# Patient Record
Sex: Female | Born: 1965 | Race: White | Hispanic: No | Marital: Married | Smoking: Former smoker
Health system: Southern US, Community
[De-identification: ages and names within clinical notes are randomized; demographics above are authoritative.]

## PROBLEM LIST (undated history)

## (undated) DIAGNOSIS — N83201 Unspecified ovarian cyst, right side: Secondary | ICD-10-CM

## (undated) DIAGNOSIS — N809 Endometriosis, unspecified: Secondary | ICD-10-CM

## (undated) DIAGNOSIS — E7800 Pure hypercholesterolemia, unspecified: Secondary | ICD-10-CM

## (undated) DIAGNOSIS — I10 Essential (primary) hypertension: Secondary | ICD-10-CM

## (undated) DIAGNOSIS — N83202 Unspecified ovarian cyst, left side: Secondary | ICD-10-CM

## (undated) DIAGNOSIS — M1990 Unspecified osteoarthritis, unspecified site: Secondary | ICD-10-CM

## (undated) DIAGNOSIS — E119 Type 2 diabetes mellitus without complications: Secondary | ICD-10-CM

## (undated) HISTORY — DX: Essential (primary) hypertension: I10

## (undated) HISTORY — PX: TONSILLECTOMY: SUR1361

## (undated) HISTORY — DX: Unspecified ovarian cyst, right side: N83.201

## (undated) HISTORY — DX: Endometriosis, unspecified: N80.9

## (undated) HISTORY — DX: Type 2 diabetes mellitus without complications: E11.9

## (undated) HISTORY — PX: ABDOMINAL HYSTERECTOMY: SHX81

## (undated) HISTORY — DX: Pure hypercholesterolemia, unspecified: E78.00

## (undated) HISTORY — PX: TUBAL LIGATION: SHX77

## (undated) HISTORY — DX: Unspecified ovarian cyst, left side: N83.202

## (undated) HISTORY — DX: Unspecified osteoarthritis, unspecified site: M19.90

---

## 1997-09-01 ENCOUNTER — Other Ambulatory Visit: Admission: RE | Admit: 1997-09-01 | Discharge: 1997-09-01 | Payer: Self-pay | Admitting: Obstetrics and Gynecology

## 1998-05-07 ENCOUNTER — Ambulatory Visit (HOSPITAL_COMMUNITY): Admission: RE | Admit: 1998-05-07 | Discharge: 1998-05-07 | Payer: Self-pay | Admitting: Obstetrics and Gynecology

## 1998-09-22 ENCOUNTER — Other Ambulatory Visit: Admission: RE | Admit: 1998-09-22 | Discharge: 1998-09-22 | Payer: Self-pay | Admitting: Obstetrics and Gynecology

## 1999-12-02 ENCOUNTER — Other Ambulatory Visit: Admission: RE | Admit: 1999-12-02 | Discharge: 1999-12-02 | Payer: Self-pay | Admitting: Obstetrics and Gynecology

## 2000-12-12 ENCOUNTER — Other Ambulatory Visit: Admission: RE | Admit: 2000-12-12 | Discharge: 2000-12-12 | Payer: Self-pay | Admitting: Obstetrics and Gynecology

## 2001-12-12 ENCOUNTER — Other Ambulatory Visit: Admission: RE | Admit: 2001-12-12 | Discharge: 2001-12-12 | Payer: Self-pay | Admitting: Obstetrics and Gynecology

## 2002-12-30 ENCOUNTER — Other Ambulatory Visit: Admission: RE | Admit: 2002-12-30 | Discharge: 2002-12-30 | Payer: Self-pay | Admitting: Obstetrics and Gynecology

## 2004-01-19 ENCOUNTER — Other Ambulatory Visit: Admission: RE | Admit: 2004-01-19 | Discharge: 2004-01-19 | Payer: Self-pay | Admitting: Obstetrics and Gynecology

## 2005-02-10 ENCOUNTER — Other Ambulatory Visit: Admission: RE | Admit: 2005-02-10 | Discharge: 2005-02-10 | Payer: Self-pay | Admitting: Obstetrics and Gynecology

## 2007-03-28 HISTORY — PX: LAPAROSCOPIC VAGINAL HYSTERECTOMY WITH SALPINGO OOPHORECTOMY: SHX6681

## 2007-10-09 ENCOUNTER — Encounter (INDEPENDENT_AMBULATORY_CARE_PROVIDER_SITE_OTHER): Payer: Self-pay | Admitting: Obstetrics and Gynecology

## 2007-10-09 ENCOUNTER — Ambulatory Visit (HOSPITAL_COMMUNITY): Admission: RE | Admit: 2007-10-09 | Discharge: 2007-10-10 | Payer: Self-pay | Admitting: Obstetrics and Gynecology

## 2010-08-09 NOTE — Op Note (Signed)
NAME:  Judith Nolan, Judith Nolan NO.:  [id number]   MEDICAL RECORD NO.:  [id number]          PATIENT TYPE:  INP   LOCATION:  9319                          FACILITY:  [facility]   PHYSICIAN:  Carrington Clamp, M.D. DATE OF BIRTH:  1966-02-21   DATE OF PROCEDURE:  10/09/2007  DATE OF DISCHARGE:                               OPERATIVE REPORT   PREOPERATIVE DIAGNOSES:  1. Menorrhagia.  2. Left lower quadrant pain.  3. Right ovarian cyst.   POSTOPERATIVE DIAGNOSES:  1. Menorrhagia.  2. Left lower quadrant pain.  3. Right ovarian cyst.  4. Endometriosis.   PROCEDURES:  Laparoscopic-assisted vaginal hysterectomy with bilateral  salpingo-oophorectomy.   SURGEON:  Carrington Clamp, MD.   ASSISTANTLuvenia Redden, MD   ANESTHESIA:  General endotracheal.   FINDINGS:  A 10 weeks' size bulky the uterus with a bulky cervix.  There  were small areas of endometriosis seen on the left broad ligament and  the left ovary.  Normal tubes were seen with a simple cyst on the right  ovary that measured about 3 cm.  Ureters were seen well out of the field  of dissection both before during and after the operation.  Small  pedunculated fibroids in the fundus and the uterus.   ESTIMATED BLOOD LOSS:  600 mL.   SPECIMENS:  Uterus, cervix, ovaries, and tubes.   IV FLUIDS:  2500 mL.   URINE OUTPUT:  Not measured.   COMPLICATIONS:  None.   MEDICATIONS:  Enfamil and 1% lidocaine with epinephrine.   COUNTS:  Were correct x3.   TECHNIQUE:  After adequate general anesthesia was achieved, the patient  was prepped, draped in usual sterile fashion in dorsal lithotomy  position.  Bladder was emptied with a red rubber catheter.  Uterine  manipulator placed inside the of the cervix.  Attention was turned to  the abdomen where a 2 cm infraumbilical incision was made with the  scalpel and the OptiVu scope was used to get through the fascia.  Unfortunately, were then able to penetrate the peritoneum  with the  OptiVu so a Veress needle was placed in the same tract and the abdomen  insufflated without complication.  There is no bowel adhesions of  anything to the anterior abdominal wall once the scope was able to be  placed inside the abdomen via the Optiscope trocar.  The above findings  were then noted and the infundibulopelvic ligaments were cauterized  bilaterally with the triple polar.  Dissection of the mesosalpinx and  broad ligament was then performed with the triple polar cautery  bilaterally to the level of the reflection of the bladder onto the  cervix.  The ureters were seen well out of the field of dissection.   Attention was then turned to the vagina where the uterine manipulator  was removed and 60 mL of Enfamil were injected into the bladder.  A  duckbill speculum placed in the vagina and the cervix was then injected  with 1% Lidocaine with epinephrine in a circumferential manner.  A  scalpel was used to make a circumferential incision at the  level of the  reflection of the vagina onto the cervix.  Mayo's were used to enter the  posterior cul-de-sac and the long duckbill retractor placed.   Dissection anteriorly was begun of the vesicouterine fascia off of the  cervix.  The uterosacrals were secured with Heaney clamps bilaterally  and each pedicle was incised the Mayo scissors and secured with a Heaney  stitch of 0 Vicryl.  The anterior peritoneum was then entered carefully  with the dissection of the Metzenbaum scissors of the bladder off of the  cervix.  Retractor was used to retract the bladder out of the way once  the peritoneum had been entered.   Alternating successive bites of the Heaney clamp were used to secure the  cardinal ligament.  Each pedicle was incised with Mayo scissors and  secured with a stitch of 0 Vicryl.  At the level of the initial  dissection superiorly with the triple polar, Heaney's were able to be  placed and each pedicle was incised and  secured with a stitch of 0  Vicryl.  Unfortunately, the fibroid was retracted medially off the  cervix and had been excised off of the anterior broad ligament and  therefore was still inside the abdomen free floating.  Bleeding was  brisk from the vaginal cuff, but all the pedicles were otherwise  hemostatic.  The vaginal cuff was run with a 2-0 Vicryl Smead-Jones  stitch in running locked fashion.  The peritoneum was closed with a  pursestring suture that incorporated the anterior peritoneum.  The left  uterosacral ligament went through the posterior cul-de-sac into the  vagina back into the vagina, did a modified Halban's culdoplasty through  the posterior cul-de-sac into the vagina back from the vagina back into  the posterior cul-de-sac well between the uterosacrals, incorporated the  right uterosacral and anterior peritoneum was cinched down in a  pursestring fashion.  The cuff was then closed with multiple figure-of-  eight stitches of 0 Vicryl.   Attention was then turned to the abdomen after gloves were changed and  scope was placed back inside the abdomen.  An attempt was made to secure  the fibroid with an Endobag, but unfortunately the Endobag tore.  Grasper was then used to grasp the fibroid and bring it through the 10  mm trocar site.  Once this was reperformed, the ureters were rechecked  and found to be peristalsing out of the field of dissection.  Hemostasis  was achieved and all instruments drawn from the abdomen.  The abdomen  desufflated.  The 2 cm infraumbilical fascial incision was closed with a  figure-of-eight stitch of 2-0 Vicryl.  The 5 mm trocar sites that had  been placed under direct visualization of the camera were closed.  Skin  incisions were closed with 3-0 Vicryl Rapide with a through-and-through  stitch.  The incisions were closed with Dermabond and a Foley was placed  in the bladder and all instruments withdrawn from the vagina.     Carrington Clamp,  M.D.  Electronically Signed    MH/MEDQ  D:  10/09/2007  T:  10/10/2007  Job:  [id number]

## 2010-08-24 ENCOUNTER — Ambulatory Visit (HOSPITAL_COMMUNITY)
Admission: RE | Admit: 2010-08-24 | Discharge: 2010-08-24 | Disposition: A | Discharge status: Home / Self Care | Payer: BC Managed Care – PPO | Source: Ambulatory Visit | Attending: Obstetrics and Gynecology | Admitting: Obstetrics and Gynecology

## 2010-08-24 DIAGNOSIS — M25519 Pain in unspecified shoulder: Secondary | ICD-10-CM | POA: Insufficient documentation

## 2010-08-24 DIAGNOSIS — R079 Chest pain, unspecified: Secondary | ICD-10-CM | POA: Insufficient documentation

## 2010-12-23 LAB — COMPREHENSIVE METABOLIC PANEL
ALT: 15
Alkaline Phosphatase: 47
BUN: 6
CO2: 27
Chloride: 105
Glucose, Bld: 133 — ABNORMAL HIGH
Potassium: 4.1
Sodium: 137
Total Bilirubin: 0.7

## 2010-12-23 LAB — CBC
HCT: 32.2 — ABNORMAL LOW
Hemoglobin: 11 — ABNORMAL LOW
MCV: 85.6
Platelets: 329
RBC: 3.74 — ABNORMAL LOW
RBC: 4.64
WBC: 13.9 — ABNORMAL HIGH
WBC: 8.5

## 2010-12-23 LAB — PREGNANCY, URINE: Preg Test, Ur: NEGATIVE

## 2011-09-27 ENCOUNTER — Other Ambulatory Visit: Payer: Self-pay | Admitting: Obstetrics and Gynecology

## 2012-03-27 DIAGNOSIS — I10 Essential (primary) hypertension: Secondary | ICD-10-CM

## 2012-03-27 HISTORY — DX: Essential (primary) hypertension: I10

## 2013-08-13 ENCOUNTER — Ambulatory Visit (INDEPENDENT_AMBULATORY_CARE_PROVIDER_SITE_OTHER): Payer: BC Managed Care – PPO | Admitting: Podiatry

## 2013-08-13 ENCOUNTER — Ambulatory Visit (INDEPENDENT_AMBULATORY_CARE_PROVIDER_SITE_OTHER): Payer: BC Managed Care – PPO

## 2013-08-13 ENCOUNTER — Encounter: Payer: Self-pay | Admitting: Podiatry

## 2013-08-13 VITALS — BP 154/88 | HR 71 | Resp 15 | Ht 67.0 in | Wt 215.0 lb

## 2013-08-13 DIAGNOSIS — M79673 Pain in unspecified foot: Secondary | ICD-10-CM

## 2013-08-13 DIAGNOSIS — M79609 Pain in unspecified limb: Secondary | ICD-10-CM

## 2013-08-13 DIAGNOSIS — M722 Plantar fascial fibromatosis: Secondary | ICD-10-CM

## 2013-08-13 MED ORDER — TRIAMCINOLONE ACETONIDE 10 MG/ML IJ SUSP
10.0000 mg | Freq: Once | INTRAMUSCULAR | Status: AC
  Administered 2013-08-13: 10 mg

## 2013-08-13 NOTE — Patient Instructions (Signed)
Using 200 mg ibuprofen tablets taking 2 tablets 3 times a day x14 days Bent knee stretching 2 minutes 3 times a day Bring in existing rigid orthotic Plantar Fasciitis Plantar fasciitis is a common condition that causes foot pain. It is soreness (inflammation) of the band of tough fibrous tissue on the bottom of the foot that runs from the heel bone (calcaneus) to the ball of the foot. The cause of this soreness may be from excessive standing, poor fitting shoes, running on hard surfaces, being overweight, having an abnormal walk, or overuse (this is common in runners) of the painful foot or feet. It is also common in aerobic exercise dancers and ballet dancers. SYMPTOMS  Most people with plantar fasciitis complain of:  Severe pain in the morning on the bottom of their foot especially when taking the first steps out of bed. This pain recedes after a few minutes of walking.  Severe pain is experienced also during walking following a long period of inactivity.  Pain is worse when walking barefoot or up stairs DIAGNOSIS   Your caregiver will diagnose this condition by examining and feeling your foot.  Special tests such as X-rays of your foot, are usually not needed. PREVENTION   Consult a sports medicine professional before beginning a new exercise program.  Walking programs offer a good workout. With walking there is a lower chance of overuse injuries common to runners. There is less impact and less jarring of the joints.  Begin all new exercise programs slowly. If problems or pain develop, decrease the amount of time or distance until you are at a comfortable level.  Wear good shoes and replace them regularly.  Stretch your foot and the heel cords at the back of the ankle (Achilles tendon) both before and after exercise.  Run or exercise on even surfaces that are not hard. For example, asphalt is better than pavement.  Do not run barefoot on hard surfaces.  If using a treadmill, vary  the incline.  Do not continue to workout if you have foot or joint problems. Seek professional help if they do not improve. HOME CARE INSTRUCTIONS   Avoid activities that cause you pain until you recover.  Use ice or cold packs on the problem or painful areas after working out.  Only take over-the-counter or prescription medicines for pain, discomfort, or fever as directed by your caregiver.  Soft shoe inserts or athletic shoes with air or gel sole cushions may be helpful.  If problems continue or become more severe, consult a sports medicine caregiver or your own health care provider. Cortisone is a potent anti-inflammatory medication that may be injected into the painful area. You can discuss this treatment with your caregiver. MAKE SURE YOU:   Understand these instructions.  Will watch your condition.  Will get help right away if you are not doing well or get worse. Document Released: 12/06/2000 Document Revised: 06/05/2011 Document Reviewed: 02/05/2008 [facility] Patient Information 2014 [employer] [place].

## 2013-08-13 NOTE — Progress Notes (Signed)
   Subjective:    Patient ID: Judith Nolan, female    DOB: 1965/12/13, 48 y.o.   MRN: [id number]  HPI Comments: N heel pain L right heel medial pain D over 1 year O continuous, worsening C bruised sensation A long periods of walk, end of the day [facility] prescribed Celebrex, that pt did not take, night splint occasionally, then family doctor gave steriod injection.    Pt states her family doctor also gave steroid pack, and stretches. The patient wears an over-the-counter flexible foot support for many years. In addition, she describes a more rigid orthotic which he does not have with her today.   Review of Systems  All other systems reviewed and are negative.      Objective:   Physical Exam Orientated x3 white female  Vascular: DP and PT pulses 2/4 bilaterally  Neurological: Sensation to 10 g monofilament wire intact 5/5 bilaterally Vibratory sensation intact bilaterally Ankle reflexes reactive bilaterally  Dermatological: Texture and turgor within normal limits bilaterally  Musculoskeletal: Palpable tenderness in the medial plantar left heel without a palpable lesions. This area duplicates the patient's area of discomfort. Upon weightbearing patient has a vertical heel and when she stands on her toes the heels invert bilaterally.   X-ray report weightbearing right foot   Intact bony structure without fracture and/or dislocation noted  Well-organized inferior calcaneal spur  No deformities noted  Radiographic impression:  No acute bony abnormality noted in the right foot           Assessment & Plan:   Assessment: Plantar fasciitis right  Plan: Offered patient Kenalog injection which he verbally consents The skin is prepped with alcohol and Betadine and 10 mg of Kenalog mixed with 10 mg of plain Xylocaine and 2.5 mg of plain Marcaine were injected into the inferior heel right for Kenalog injection #1 (please note patient said 1 steroid  injection approximately year prior)  She is advised to use over-the-counter 200 mg ibuprofen tablets by mouth 2 tablets 3 times a day x14 days Shoeing and stretching discussed  Maintain existing semirigid over-the-counter orthotic  Patient's request to bring in the existing rigid orthotic to see if it is adequate. If not we'll consider a custom rigid orthotic.  Reappoint x2 weeks

## 2013-08-14 ENCOUNTER — Encounter: Payer: Self-pay | Admitting: Podiatry

## 2013-08-27 ENCOUNTER — Ambulatory Visit (INDEPENDENT_AMBULATORY_CARE_PROVIDER_SITE_OTHER): Payer: BC Managed Care – PPO | Admitting: Podiatry

## 2013-08-27 ENCOUNTER — Encounter: Payer: Self-pay | Admitting: Podiatry

## 2013-08-27 VITALS — BP 124/80 | HR 59 | Resp 15 | Ht 67.0 in | Wt 215.0 lb

## 2013-08-27 DIAGNOSIS — M722 Plantar fascial fibromatosis: Secondary | ICD-10-CM

## 2013-08-27 MED ORDER — TRIAMCINOLONE ACETONIDE 10 MG/ML IJ SUSP
10.0000 mg | Freq: Once | INTRAMUSCULAR | Status: AC
  Administered 2013-08-27: 10 mg

## 2013-08-27 NOTE — Progress Notes (Signed)
Patient ID: Judith Nolan, female   DOB: Dec 02, 1965, 48 y.o.   MRN: [id number]  Subjective: This patient presents today stating that the right heel has less pain after the Kenalog Injection visit of 08/14/2013. Denies any complaints from ibuprofen over-the-counter tablets, however, admits that she only takes 4 tablets a day  Objective: There is palpable tenderness in medial plantar aspect of the right heel without a palpable lesions. Patient has a semirigid orthotic with intrinsic rear foot and forefoot post covered with leather the contour satisfactorily assessment:  Assessment: Improving plantar fasciitis right  Plan: Skin is prepped with alcohol and Betadine and 10 mg of Kenalog mixed with 10 mg of plain Xylocaine and 2.5 mg of plain Marcaine are injected inferior heel right for Kenalog injection #2.  Maintained bent knee stretching Continue on over-the-counter ibuprofen tablets 2 tablets by mouth 3 times a day  Reappoint x4 weeks

## 2013-08-27 NOTE — Patient Instructions (Signed)
Bent knee stretching with shoes on 3 times a day x2 minutes

## 2013-09-24 ENCOUNTER — Ambulatory Visit (INDEPENDENT_AMBULATORY_CARE_PROVIDER_SITE_OTHER): Payer: BC Managed Care – PPO | Admitting: Podiatry

## 2013-09-24 ENCOUNTER — Encounter: Payer: Self-pay | Admitting: Podiatry

## 2013-09-24 VITALS — BP 142/76 | HR 62 | Resp 12

## 2013-09-24 DIAGNOSIS — M722 Plantar fascial fibromatosis: Secondary | ICD-10-CM

## 2013-09-25 ENCOUNTER — Encounter: Payer: Self-pay | Admitting: Podiatry

## 2013-09-25 NOTE — Progress Notes (Signed)
Patient ID: Mearl LatinKathleen F Nolan, female   DOB: 03/04/1966, 48 y.o.   MRN: [id number]  Subjective: Patient presents postop a right plantar fasciitis after having 2 Kenalog injections. At this time the right heel has improved considerably. Currently she is taking over-the-counter ibuprofen 200 mg tablets 3 times a day without a complaint medication She has a over-the-counter semirigid orthotic which exacerbates the right plantar fasciitis when she removes it the heel pain improves  Objective: Orientated x323 space 48 year old white female  There is palpable tenderness in the medial plantar right heel without a palpable lesions. This area duplicates her area of discomfort There is no erythema, edema, ecchymosis or skin lesions noted in the right foot  Assessment: Improving but persistent low-grade plantar fasciitis right Exacerbation of right heel pain with semirigid orthotic  Plan: DC wearing the semirigid orthotic Wear comfortable athletic running or walking shoes Maintain over-the-counter 200 mg tablets of ibuprofen 2 tablets 3 times a day an additional 2 weeks Maintain stretching  Reappoint at patient's request  If patient return complaining of right plantar fasciitis we'll consider a accommodative orthotic with a soft top cover.

## 2014-03-27 DIAGNOSIS — E7800 Pure hypercholesterolemia, unspecified: Secondary | ICD-10-CM

## 2014-03-27 HISTORY — DX: Pure hypercholesterolemia, unspecified: E78.00

## 2014-11-20 ENCOUNTER — Other Ambulatory Visit: Payer: Self-pay | Admitting: Obstetrics and Gynecology

## 2014-11-20 LAB — HM PAP SMEAR: HM PAP: NEGATIVE

## 2014-11-23 LAB — CYTOLOGY - PAP

## 2014-11-24 LAB — HM MAMMOGRAPHY

## 2015-06-30 ENCOUNTER — Telehealth: Payer: Self-pay | Admitting: Family Medicine

## 2015-06-30 NOTE — Telephone Encounter (Signed)
[facility]ammogram,pap

## 2015-07-05 ENCOUNTER — Encounter: Payer: Self-pay | Admitting: *Deleted

## 2015-07-08 ENCOUNTER — Ambulatory Visit (INDEPENDENT_AMBULATORY_CARE_PROVIDER_SITE_OTHER): Payer: Managed Care, Other (non HMO) | Admitting: Family Medicine

## 2015-07-08 ENCOUNTER — Encounter: Payer: Self-pay | Admitting: Family Medicine

## 2015-07-08 VITALS — BP 112/68 | HR 80 | Ht 67.0 in | Wt 204.4 lb

## 2015-07-08 DIAGNOSIS — I10 Essential (primary) hypertension: Secondary | ICD-10-CM | POA: Diagnosis not present

## 2015-07-08 DIAGNOSIS — E119 Type 2 diabetes mellitus without complications: Secondary | ICD-10-CM | POA: Diagnosis not present

## 2015-07-08 DIAGNOSIS — Z1159 Encounter for screening for other viral diseases: Secondary | ICD-10-CM | POA: Diagnosis not present

## 2015-07-08 DIAGNOSIS — Z5181 Encounter for therapeutic drug level monitoring: Secondary | ICD-10-CM

## 2015-07-08 DIAGNOSIS — E782 Mixed hyperlipidemia: Secondary | ICD-10-CM | POA: Diagnosis not present

## 2015-07-08 LAB — LIPID PANEL
CHOLESTEROL: 182 mg/dL (ref 125–200)
HDL: 42 mg/dL — ABNORMAL LOW (ref >=46)
LDL Cholesterol: 85 mg/dL (ref <130)
Total CHOL/HDL Ratio: 4.3 Ratio (ref <=5.0)
Triglycerides: 277 mg/dL — ABNORMAL HIGH (ref <150)
VLDL: 55 mg/dL — ABNORMAL HIGH (ref <30)

## 2015-07-08 LAB — HEPATIC FUNCTION PANEL
ALT: 18 U/L (ref 6–29)
AST: 17 U/L (ref 10–35)
Albumin: 4.6 g/dL (ref 3.6–5.1)
Alkaline Phosphatase: 56 U/L (ref 33–130)
BILIRUBIN DIRECT: 0.1 mg/dL (ref <=0.2)
BILIRUBIN INDIRECT: 0.4 mg/dL (ref 0.2–1.2)
BILIRUBIN TOTAL: 0.5 mg/dL (ref 0.2–1.2)
Total Protein: 7.2 g/dL (ref 6.1–8.1)

## 2015-07-08 LAB — GLUCOSE, RANDOM: Glucose, Bld: 88 mg/dL (ref 65–99)

## 2015-07-08 LAB — POCT GLYCOSYLATED HEMOGLOBIN (HGB A1C): HEMOGLOBIN A1C: 5.9

## 2015-07-08 MED ORDER — LISINOPRIL 20 MG PO TABS: 20.0000 mg | ORAL_TABLET | Freq: Every day | ORAL | Status: DC

## 2015-07-08 MED ORDER — METFORMIN HCL 500 MG PO TABS: 500.0000 mg | ORAL_TABLET | Freq: Two times a day (BID) | ORAL | Status: DC

## 2015-07-08 MED ORDER — FISH OIL 1000 MG PO CPDR: 2.0000 | DELAYED_RELEASE_CAPSULE | Freq: Two times a day (BID) | ORAL | Status: DC

## 2015-07-08 NOTE — Progress Notes (Signed)
Chief Complaint  Patient presents with  . Establish Care    med check for diabetes-has been almost 6 months since last med check with Dr.York in [place].    Diabetes was diagnosed last year. Last check was good "body not reading that I'm a diabetic". Fasting sugar today was 118, usually ranging 118-130's.  She checked it after work one day and it was 60 (felt tired).  She is due in June for eye exam. Never had diabetic eye exam. Never gave a urine specimen at her prior doctor's office. Records from former PCP not yet received.  Hypertension: occasionally checks BP at home, always normal.  No headaches, dizziness, side effects. No cough, chest pain, edema.  She sees Dr. Philis Pique for GYN.  She was referred to Dr. Adriana Mccallum for colonoscopy but hasn't scheduled this yet.  +family history colon cancer.  While in office, we got [facility] to fax her last labs. 01/26/15 CBC normal c-met normal except fasting glucose 113 Chol 176, TG 367, HDL 31, LDL 72 TSH 3.267 A1c 6.0  (6.2% in 07/2014) Vitamin D-OH 46  Denies any regular exercise. 1/2 and 1/2 tea (sweet/unsweet), no sodas or juices.  Past Medical History  Diagnosis Date  . Hypertension 2014  . Type 2 diabetes mellitus ([place]) age [age]  . Pure hypercholesterolemia 2016  . Endometriosis     s/p hyst/BSO  . Bilateral ovarian cysts     s/p hyst/BSO   Past Surgical History  Procedure Laterality Date  . Laparoscopic vaginal hysterectomy with salpingo oophorectomy  2009    complete; for ovarian cysts, endometriosis  . Tonsillectomy  age 18  . Tubal ligation     Social History   Social History  . Marital Status: Married    Spouse Name: N/A  . Number of Children: 1  . Years of Education: N/A   Occupational History  . office work--[employer]    Social History Main Topics  . Smoking status: Former Research scientist (life sciences)  . Smokeless tobacco: Not on file     Comment: smoked x 1 year when she was 89  . Alcohol Use: No  .  Drug Use: No  . Sexual Activity:    Partners: Male    Birth Control/ Protection: Surgical   Other Topics Concern  . Not on file   Social History Narrative   Married, 1 son, 2 dogs, 1 cat.  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   Son lives across the street with his girlfriend.   Family History  Problem Relation Age of Onset  . Hypertension Mother   . Cancer Maternal Grandmother     breast cancer in her 80's  . Breast cancer Maternal Grandmother   . Cancer Maternal Grandfather     colon (over the age of 37)  . Colon cancer Maternal Grandfather   . Diabetes Paternal Grandmother   . Diabetes Paternal Grandfather   . Heart disease Neg Hx   . Stroke Neg Hx    Outpatient Encounter Prescriptions as of 07/08/2015  Medication Sig  . estradiol (ESTRACE) 0.5 MG tablet Take 0.5 mg by mouth daily.  Marland Kitchen lisinopril (PRINIVIL,ZESTRIL) 20 MG tablet Take 1 tablet (20 mg total) by mouth daily.  Marland Kitchen lovastatin (MEVACOR) 20 MG tablet Take 20 mg by mouth at bedtime.  . metFORMIN (GLUCOPHAGE) 500 MG tablet Take 1 tablet (500 mg total) by mouth 2 (two) times daily with a meal.  . Multiple Vitamins-Minerals (MULTIVITAMIN WITH MINERALS) tablet Take 1  tablet by mouth daily.  Marland Kitchen VITAMIN D, CHOLECALCIFEROL, PO Take 5,000 Int'l Units by mouth daily.  . [DISCONTINUED] lisinopril (PRINIVIL,ZESTRIL) 20 MG tablet Take 20 mg by mouth daily.  . [DISCONTINUED] metFORMIN (GLUCOPHAGE) 500 MG tablet Take 500 mg by mouth 2 (two) times daily with a meal.  . Omega-3 Fatty Acids (FISH OIL) 1000 MG CPDR Take 2 capsules by mouth 2 (two) times daily.  . [DISCONTINUED] atenolol (TENORMIN) 25 MG tablet Take by mouth daily.  . [DISCONTINUED] lisinopril (PRINIVIL,ZESTRIL) 10 MG tablet Take 10 mg by mouth daily.   No facility-administered encounter medications on file as of 07/08/2015.   Allergies  Allergen Reactions  . Codeine Rash   ROS: no headaches, dizziness, numbness, tingling, vision changes, syncope,  fever, chills, URI symptoms, shortness of breath, edema, GI or GU complaints, bleeding, bruising, rash, depression, joint pains or other concerns.   PHYSICAL EXAM: BP 112/68 mmHg  Pulse 80  Ht '5\' 7"'  (1.702 m)  Wt 204 lb 6.4 oz (92.715 kg)  BMI 32.01 kg/m2  Well developed, well nourished patient, in no distress HEENT: PERRL, EOMI, conjunctiva and sclera are clear, OP clear Neck: No lymphadenopathy or thyromegaly, no carotid bruit Heart:  Regular rate and rhythm, no murmurs, rubs, gallops or ectopy Lungs:  Clear bilaterally, without wheezes, rales or ronchi Abdomen:  Soft, nontender, nondistended, no hepatosplenomegaly or masses, normal bowel sounds Extremities:  No clubbing, cyanosis or edema, 2+ pulses.  Normal diabetic foot exam Neuro:  Alert and oriented x 3, cranial nerves grossly intact. Normal strength and sensation, normal gait. Back:  No spinal or CVA tenderness Skin: no rashes or suspicious lesions Psych:  Normal mood, affect, hygiene and grooming, normal speech, eye contact   Lab Results  Component Value Date   HGBA1C 5.9 07/08/2015   ASSESSMENT/PLAN:  Diabetes mellitus without complication (HCC) - controlled; encouraged exercise, weight loss - Plan: HgB A1c, Glucose, random, Microalbumin / creatinine urine ratio, metFORMIN (GLUCOPHAGE) 500 MG tablet  Mixed hyperlipidemia - reviewed lowfat, low cholesterol diet. add fish oil. may need statin changed to better one, vs needing med for TG - Plan: Lipid panel, Hepatic function panel  Medication monitoring encounter - Plan: Lipid panel, Hepatic function panel, Glucose, random  Need for hepatitis C screening test - Plan: Hepatitis C antibody  Essential hypertension - controlled - Plan: lisinopril (PRINIVIL,ZESTRIL) 20 MG tablet    Lipids, LFT's, urine microalbumin, hep C   Diet reviewed Cut back on sweet tea, daily exercise, fish oil 4083m daily.    Please schedule a DIABETIC EYE EXAM and have them send a copy of  the report to our office.  Please be sure to get flu shots yearly in the Fall  Please call back Dr. MCollene Maresor Dr. HBenson Norwayto schedule your colonoscopy.  Please add omega-3 fish oil 40056md (4 capsules, can split it into 2 doses). Please limit the sugar and sweets in the diet, as well as the fried foods.  It is recommended that you get at least 30 minutes of aerobic exercise at least 5 days/week (for weight loss, you may need as much as 60-90 minutes). This can be any activity that gets your heart rate up. This can be divided in 10-15 minute intervals if needed, but try and build up your endurance at least once a week.  Weight bearing exercise is also recommended twice weekly.   If you triglycerides remain >200, I likely will change the statin to a stronger medication, and if not helping, may  need a separate medication for triglycerides.

## 2015-07-08 NOTE — Patient Instructions (Signed)
  Please schedule a DIABETIC EYE EXAM and have them send a copy of the report to our office.  Please be sure to get flu shots yearly in the Fall  Please call back Dr. Loreta AveMann or Dr. Elnoria HowardHung to schedule your colonoscopy.  Please add omega-3 fish oil 4000mg /d (4 capsules, can split it into 2 doses). Please limit the sugar and sweets in the diet, as well as the fried foods.  It is recommended that you get at least 30 minutes of aerobic exercise at least 5 days/week (for weight loss, you may need as much as 60-90 minutes). This can be any activity that gets your heart rate up. This can be divided in 10-15 minute intervals if needed, but try and build up your endurance at least once a week.  Weight bearing exercise is also recommended twice weekly.   If you triglycerides remain >200, I likely will change the statin to a stronger medication, and if not helping, may need a separate medication for triglycerides.

## 2015-07-09 ENCOUNTER — Encounter: Payer: Self-pay | Admitting: Family Medicine

## 2015-07-09 DIAGNOSIS — I10 Essential (primary) hypertension: Secondary | ICD-10-CM | POA: Insufficient documentation

## 2015-07-09 DIAGNOSIS — E1169 Type 2 diabetes mellitus with other specified complication: Secondary | ICD-10-CM | POA: Insufficient documentation

## 2015-07-09 DIAGNOSIS — E782 Mixed hyperlipidemia: Secondary | ICD-10-CM | POA: Insufficient documentation

## 2015-07-09 DIAGNOSIS — E119 Type 2 diabetes mellitus without complications: Secondary | ICD-10-CM | POA: Insufficient documentation

## 2015-07-09 LAB — MICROALBUMIN / CREATININE URINE RATIO
Creatinine, Urine: 36 mg/dL (ref 20–320)
MICROALB/CREAT RATIO: 6 ug/mg{creat} (ref <30)
Microalb, Ur: 0.2 mg/dL

## 2015-07-09 LAB — HEPATITIS C ANTIBODY: HCV AB: NEGATIVE

## 2015-07-09 MED ORDER — LOVASTATIN 20 MG PO TABS: 20.0000 mg | ORAL_TABLET | Freq: Every day | ORAL | Status: DC

## 2015-07-19 ENCOUNTER — Telehealth: Payer: Self-pay | Admitting: Family Medicine

## 2015-07-19 ENCOUNTER — Encounter: Payer: Self-pay | Admitting: Family Medicine

## 2015-07-20 NOTE — Telephone Encounter (Signed)
error 

## 2015-08-16 ENCOUNTER — Telehealth: Payer: Self-pay

## 2015-08-16 NOTE — Telephone Encounter (Signed)
[facility] Placed in your folder for review. Trixie Rude/RLB

## 2015-12-10 LAB — HM MAMMOGRAPHY

## 2015-12-26 NOTE — Progress Notes (Signed)
Chief Complaint  Patient presents with  . Diabetes    fasting med check, no concerns.    Hyperlipidemia:  Patient was asked to start taking fish oil after her last visit, due to elevated TG.  She has been taking 2 BID without side effects. She is compliant with her statin, and tries to follow a low cholesterol diet.  Hypertension follow-up:  Blood pressures was 130/85 when she tried to donate blood 2 weeks ago; hasn't otherwise been checking it elsewhere.  Denies dizziness, headaches, chest pain.  Denies side effects of medications.  Diabetes follow-up:  Blood sugars at home are running 120's in the morning; doesn't check other times of the day.  Denies hypoglycemia.  Denies polydipsia and polyuria.  Last eye exam was 11/12/15.  Patient follows a low sugar diet and checks feet regularly without concerns. She has an "ugly toenail"--left great toe is discolored. She used an OTC clear liquid for toenail fungus without any benefit. She has also been clipping back the abnormal part.  Denies ingrowing nail or pain.  She was referred by her OB-GYN to Dr. Nicholes Mango for colonoscopy but hasn't scheduled this yet.  +family history colon cancer.  She was rejected from donating blood 2 weeks ago--her Hg was 12.3 (needs to be 12.5). Denies any bleeding.  PMH, PSH, SH reviewed  Outpatient Encounter Prescriptions as of 12/27/2015  Medication Sig  . estradiol (ESTRACE) 0.5 MG tablet Take 0.5 mg by mouth daily.  Marland Kitchen lisinopril (PRINIVIL,ZESTRIL) 20 MG tablet Take 1 tablet (20 mg total) by mouth daily.  Marland Kitchen lovastatin (MEVACOR) 20 MG tablet Take 1 tablet (20 mg total) by mouth at bedtime.  . metFORMIN (GLUCOPHAGE) 500 MG tablet Take 1 tablet (500 mg total) by mouth 2 (two) times daily with a meal.  . Multiple Vitamins-Minerals (MULTIVITAMIN WITH MINERALS) tablet Take 1 tablet by mouth daily.  . Omega-3 Fatty Acids (FISH OIL) 1000 MG CPDR Take 2 capsules by mouth 2 (two) times daily.  Marland Kitchen VITAMIN D, CHOLECALCIFEROL, PO  Take 5,000 Int'l Units by mouth daily.   No facility-administered encounter medications on file as of 12/27/2015.    Allergies  Allergen Reactions  . Codeine Rash   ROS: no fever, chills, headaches, dizziness, numbness, tingling, URI symptoms, cough, shortness of breath, chest pain, GI or GU complaints. +toenail change per HPI. 8# weight gain since last visit in April. No regular exercise  PHYSICAL EXAM:  BP 120/70 (BP Location: Left Arm, Patient Position: Sitting, Cuff Size: Normal)   Pulse 72   Ht 5\' 7"  (1.702 m)   Wt 212 lb 6.4 oz (96.3 kg)   BMI 33.27 kg/m   Well appearing, pleasant, obese female in no distress HEENT: PERRL, EOMI, conjunctiva and sclera are clear. OP clear Neck: no lymphadenopathy thyromegaly or carotid bruit Heart: regular rate and rhythm Lungs: clear bilaterally Back: no CVA or spinal tenderness Abdomen: soft, nontender, no mass Extremities: no edema, 2+ pulses, normal sensation. Left great toenail--discolored, slightly thickened, but has had the medial portion cut away. No evidence of ingrowing nail, nontender. Skin: normal turgor, no rashes Neuro: cranial nerves intact, normal strength, sensation, gait Psych: normal mood, affect, hygiene and grooming  Lab Results  Component Value Date   HGBA1C 6.0 12/27/2015    ASSESSMENT/PLAN  Diabetes mellitus without complication (HCC) - controlled--encouraged daily exercise and weight loss. diet reviewed - Plan: HgB A1c, metFORMIN (GLUCOPHAGE) 500 MG tablet, Comprehensive metabolic panel, TSH  Mixed hyperlipidemia - Plan: Lipid panel  Essential hypertension -  controlled - Plan: lisinopril (PRINIVIL,ZESTRIL) 20 MG tablet, Comprehensive metabolic panel  Immunization due - pneumovax (due to DM) and Tdap given; plans to get flu shot at pharmacy (vs checking with insurance and getting here) - Plan: Tdap vaccine greater than or equal to 7yo IM, Pneumococcal polysaccharide vaccine 23-valent greater than or equal to  2yo subcutaneous/IM  Onychomycosis - left great toe; asymptomatic. Shown proper way to cut nails, discussed s/sx ingrowing/infection. f/u if desires treatment   lipids, c-met, TSH today. (not CBC because Hg okay recently when attempting to donate blood).   Discussed onychomycosis.  Asymptomatic now. Discussed proper way to cut toenails.  F/u if painful, worsening.  Exercise, weight loss discussed in detail  Flu shot recommended--may get at pharmacy if less expensive. Risks reviewed.  Risks/side effects of Tdap and pneumovax reviewed in detail  F/u 6 month med check   REFILL STATIN AFTER LABS back-- Addendum--TG >300; will try increasing lovastatin to 20m, consider change to different/stronger statin in future. Lab visit in 2 months.

## 2015-12-27 ENCOUNTER — Encounter: Payer: Self-pay | Admitting: Family Medicine

## 2015-12-27 ENCOUNTER — Ambulatory Visit (INDEPENDENT_AMBULATORY_CARE_PROVIDER_SITE_OTHER): Payer: Managed Care, Other (non HMO) | Admitting: Family Medicine

## 2015-12-27 VITALS — BP 120/70 | HR 72 | Ht 67.0 in | Wt 212.4 lb

## 2015-12-27 DIAGNOSIS — Z23 Encounter for immunization: Secondary | ICD-10-CM

## 2015-12-27 DIAGNOSIS — Z800 Family history of malignant neoplasm of digestive organs: Secondary | ICD-10-CM

## 2015-12-27 DIAGNOSIS — B351 Tinea unguium: Secondary | ICD-10-CM

## 2015-12-27 DIAGNOSIS — E119 Type 2 diabetes mellitus without complications: Secondary | ICD-10-CM

## 2015-12-27 DIAGNOSIS — I10 Essential (primary) hypertension: Secondary | ICD-10-CM | POA: Diagnosis not present

## 2015-12-27 DIAGNOSIS — E782 Mixed hyperlipidemia: Secondary | ICD-10-CM | POA: Diagnosis not present

## 2015-12-27 LAB — LIPID PANEL
Cholesterol: 183 mg/dL (ref 125–200)
HDL: 43 mg/dL — AB (ref >=46)
LDL Cholesterol: 80 mg/dL (ref <130)
Total CHOL/HDL Ratio: 4.3 Ratio (ref <=5.0)
Triglycerides: 302 mg/dL — ABNORMAL HIGH (ref <150)
VLDL: 60 mg/dL — ABNORMAL HIGH (ref <30)

## 2015-12-27 LAB — COMPREHENSIVE METABOLIC PANEL
ALBUMIN: 4.4 g/dL (ref 3.6–5.1)
ALT: 20 U/L (ref 6–29)
AST: 20 U/L (ref 10–35)
Alkaline Phosphatase: 49 U/L (ref 33–130)
BUN: 12 mg/dL (ref 7–25)
CALCIUM: 9.2 mg/dL (ref 8.6–10.4)
CHLORIDE: 100 mmol/L (ref 98–110)
CO2: 26 mmol/L (ref 20–31)
CREATININE: 0.58 mg/dL (ref 0.50–1.05)
Glucose, Bld: 97 mg/dL (ref 65–99)
Potassium: 4.3 mmol/L (ref 3.5–5.3)
SODIUM: 138 mmol/L (ref 135–146)
TOTAL PROTEIN: 7.2 g/dL (ref 6.1–8.1)
Total Bilirubin: 0.5 mg/dL (ref 0.2–1.2)

## 2015-12-27 LAB — POCT GLYCOSYLATED HEMOGLOBIN (HGB A1C): HEMOGLOBIN A1C: 6

## 2015-12-27 LAB — TSH: TSH: 2 m[IU]/L

## 2015-12-27 MED ORDER — LISINOPRIL 20 MG PO TABS: 20.0000 mg | ORAL_TABLET | Freq: Every day | ORAL | 1 refills | Status: DC

## 2015-12-27 MED ORDER — METFORMIN HCL 500 MG PO TABS: 500.0000 mg | ORAL_TABLET | Freq: Two times a day (BID) | ORAL | 1 refills | Status: DC

## 2015-12-27 NOTE — Patient Instructions (Addendum)
Continue your current medications.  Your blood pressure and diabetes are well controlled.  Please be sure to get at least 150 minutes of aerobic exercise each week (and weight-bearing exercise 2x/week).  Please limit your sweets, carbs and portion sizes, eating more vegetables, salads and fruit as snacks, in order to help lose the weight you have gained.  Please call to schedule your colonoscopy!  Please get your flu shot from the pharmacy (we are happy to give it here, but I would verify with your insurance that the cost wouldn't be more than getting it at the pharmacy).

## 2015-12-28 MED ORDER — LOVASTATIN 40 MG PO TABS: 40.0000 mg | ORAL_TABLET | Freq: Every day | ORAL | 0 refills | Status: DC

## 2016-01-28 ENCOUNTER — Encounter: Payer: Self-pay | Admitting: Family Medicine

## 2016-03-01 ENCOUNTER — Other Ambulatory Visit: Payer: Managed Care, Other (non HMO)

## 2016-03-01 DIAGNOSIS — E785 Hyperlipidemia, unspecified: Secondary | ICD-10-CM

## 2016-03-01 DIAGNOSIS — Z79899 Other long term (current) drug therapy: Secondary | ICD-10-CM

## 2016-03-01 LAB — HEPATIC FUNCTION PANEL
ALBUMIN: 4.1 g/dL (ref 3.6–5.1)
ALT: 26 U/L (ref 6–29)
AST: 23 U/L (ref 10–35)
Alkaline Phosphatase: 60 U/L (ref 33–130)
BILIRUBIN DIRECT: 0.1 mg/dL (ref <=0.2)
Indirect Bilirubin: 0.4 mg/dL (ref 0.2–1.2)
Total Bilirubin: 0.5 mg/dL (ref 0.2–1.2)
Total Protein: 7.1 g/dL (ref 6.1–8.1)

## 2016-03-01 LAB — LIPID PANEL
CHOL/HDL RATIO: 4.5 ratio (ref <5.0)
CHOLESTEROL: 174 mg/dL (ref <200)
HDL: 39 mg/dL — AB (ref >50)
LDL Cholesterol: 69 mg/dL (ref <100)
TRIGLYCERIDES: 330 mg/dL — AB (ref <150)
VLDL: 66 mg/dL — ABNORMAL HIGH (ref <30)

## 2016-03-02 ENCOUNTER — Other Ambulatory Visit: Payer: Self-pay | Admitting: *Deleted

## 2016-03-02 DIAGNOSIS — E782 Mixed hyperlipidemia: Secondary | ICD-10-CM

## 2016-03-02 MED ORDER — CHOLINE FENOFIBRATE 45 MG PO CPDR: 45.0000 mg | DELAYED_RELEASE_CAPSULE | Freq: Every day | ORAL | 1 refills | Status: DC

## 2016-03-06 ENCOUNTER — Telehealth: Payer: Self-pay | Admitting: *Deleted

## 2016-03-06 MED ORDER — FENOFIBRATE 54 MG PO TABS: 54.0000 mg | ORAL_TABLET | Freq: Every day | ORAL | 1 refills | Status: DC

## 2016-03-06 NOTE — Telephone Encounter (Signed)
error 

## 2016-03-14 ENCOUNTER — Encounter: Payer: Self-pay | Admitting: Family Medicine

## 2016-03-14 ENCOUNTER — Ambulatory Visit (INDEPENDENT_AMBULATORY_CARE_PROVIDER_SITE_OTHER): Payer: Managed Care, Other (non HMO) | Admitting: Family Medicine

## 2016-03-14 VITALS — BP 120/80 | HR 83 | Temp 98.7°F | Resp 16 | Wt 210.8 lb

## 2016-03-14 DIAGNOSIS — J029 Acute pharyngitis, unspecified: Secondary | ICD-10-CM

## 2016-03-14 DIAGNOSIS — J069 Acute upper respiratory infection, unspecified: Secondary | ICD-10-CM | POA: Diagnosis not present

## 2016-03-14 LAB — POCT RAPID STREP A (OFFICE): Rapid Strep A Screen: NEGATIVE

## 2016-03-14 MED ORDER — AMOXICILLIN 875 MG PO TABS: 875.0000 mg | ORAL_TABLET | Freq: Two times a day (BID) | ORAL | 0 refills | Status: DC

## 2016-03-14 NOTE — Progress Notes (Signed)
Subjective: Chief Complaint  Patient presents with  . sore throat    sore throat, coughing, low grade fever,      Judith Nolan is a 50 y.o. female who presents for evaluation of sore throat.  She has not had a recent close exposure to someone with proven streptococcal pharyngitis.  Sore throat started 2 days ago but was preceded by a 2 week history of rhinorrhea, nasal congestion, post nasal drainage, chest congestion, and cough. Reports a severe sore throat and pain with swallowing. No difficulty swallowing fluids.   Does not smoke.  She is immunocompetent.   Treatment to date: ibuprofen.  ? sick contacts.  No other aggravating or relieving factors.  No other c/o.  The following portions of the patient's history were reviewed and updated as appropriate: allergies, current medications, past medical history, past social history, past surgical history and problem list.  ROS as in subjective   Objective: Vitals:   03/14/16 1003  BP: 120/80  Pulse: 83  Resp: 16  Temp: 98.7 F (37.1 C)    General appearance: no distress, WD/WN, mildly ill-appearing HEENT: normocephalic, conjunctiva/corneas normal, sclerae anicteric, no sinus tenderness, nares patent, no discharge or erythema, pharynx with erythema, mild edema, patent airway, no exudate.  Oral cavity: MMM, no lesions  Neck: supple, no lymphadenopathy, no thyromegaly Heart: RRR, normal S1, S2, no murmurs Lungs: CTA bilaterally, no wheezes, rhonchi, or rales    Laboratory Strep test done. Results:negative.    Assessment and Plan:  Acute pharyngitis, unspecified etiology - Plan: POCT rapid strep A  Acute URI  Antibiotic prescribed based on course and length of illness.  Discussed symptomatic treatment including salt water gargles, warm fluids, rest, hydrate well, can use over-the-counter Tylenol or ibuprofen for throat pain, fever, or malaise. She will follow up if worse or not back to baseline after completing the antibiotic.

## 2016-03-14 NOTE — Patient Instructions (Addendum)
Your strep test is negative. Salt water gargles, ibuprofen and Chloraseptic for throat pain and discomfort. Stay well hydrated. I am prescribing an antibiotic based on your 2 week illness and new onset sore throat. If you have difficulty swallowing your saliva or fluids then this is a medical emergency. If you are not back to baseline after completing the antibiotic let us know.  Pharyngitis Pharyngitis is redness, pain, and swelling (inflammation) of your pharynx. What are the causes? Pharyngitis is usually caused by infection. Most of the time, these infections are from viruses (viral) and are part of a cold. However, sometimes pharyngitis is caused by bacteria (bacterial). Pharyngitis can also be caused by allergies. Viral pharyngitis may be spread from person to person by coughing, sneezing, and personal items or utensils (cups, forks, spoons, toothbrushes). Bacterial pharyngitis may be spread from person to person by more intimate contact, such as kissing. What are the signs or symptoms? Symptoms of pharyngitis include:  Sore throat.  Tiredness (fatigue).  Low-grade fever.  Headache.  Joint pain and muscle aches.  Skin rashes.  Swollen lymph nodes.  Plaque-like film on throat or tonsils (often seen with bacterial pharyngitis). How is this diagnosed? Your health care provider will ask you questions about your illness and your symptoms. Your medical history, along with a physical exam, is often all that is needed to diagnose pharyngitis. Sometimes, a rapid strep test is done. Other lab tests may also be done, depending on the suspected cause. How is this treated? Viral pharyngitis will usually get better in 3-4 days without the use of medicine. Bacterial pharyngitis is treated with medicines that kill germs (antibiotics). Follow these instructions at home:  Drink enough water and fluids to keep your urine clear or pale yellow.  Only take over-the-counter or prescription medicines  as directed by your health care provider:  If you are prescribed antibiotics, make sure you finish them even if you start to feel better.  Do not take aspirin.  Get lots of rest.  Gargle with 8 oz of salt water ( tsp of salt per 1 qt of water) as often as every 1-2 hours to soothe your throat.  Throat lozenges (if you are not at risk for choking) or sprays may be used to soothe your throat. Contact a health care provider if:  You have large, tender lumps in your neck.  You have a rash.  You cough up green, yellow-brown, or bloody spit. Get help right away if:  Your neck becomes stiff.  You drool or are unable to swallow liquids.  You vomit or are unable to keep medicines or liquids down.  You have severe pain that does not go away with the use of recommended medicines.  You have trouble breathing (not caused by a stuffy nose). This information is not intended to replace advice given to you by your health care provider. Make sure you discuss any questions you have with your health care provider. Document Released: 03/13/2005 Document Revised: 08/19/2015 Document Reviewed: 11/18/2012 Elsevier Interactive Patient Education  2017 [employer]

## 2016-04-11 ENCOUNTER — Other Ambulatory Visit: Payer: Self-pay | Admitting: Family Medicine

## 2016-04-11 DIAGNOSIS — E782 Mixed hyperlipidemia: Secondary | ICD-10-CM

## 2016-04-13 ENCOUNTER — Telehealth: Payer: Self-pay | Admitting: Family Medicine

## 2016-04-13 MED ORDER — LOVASTATIN 40 MG PO TABS: 40.0000 mg | ORAL_TABLET | Freq: Every day | ORAL | 0 refills | Status: DC

## 2016-04-13 NOTE — Telephone Encounter (Signed)
Pt called for status on Lovastatin 40 mg   Pt pf [phone]

## 2016-04-13 NOTE — Telephone Encounter (Signed)
Ok to refill lovastatin 40mg , #90 with no refill. V--I think this may be in your refill request to do. Please let pt know when done. She is scheduled for lipids this month

## 2016-04-14 ENCOUNTER — Other Ambulatory Visit: Payer: Self-pay

## 2016-04-18 ENCOUNTER — Other Ambulatory Visit: Payer: Managed Care, Other (non HMO)

## 2016-04-18 DIAGNOSIS — E782 Mixed hyperlipidemia: Secondary | ICD-10-CM

## 2016-04-18 LAB — LIPID PANEL
Cholesterol: 156 mg/dL (ref <200)
HDL: 38 mg/dL — AB (ref >50)
LDL CALC: 83 mg/dL (ref <100)
Total CHOL/HDL Ratio: 4.1 Ratio (ref <5.0)
Triglycerides: 174 mg/dL — ABNORMAL HIGH (ref <150)
VLDL: 35 mg/dL — ABNORMAL HIGH (ref <30)

## 2016-05-03 ENCOUNTER — Other Ambulatory Visit: Payer: Self-pay | Admitting: Family Medicine

## 2016-06-30 NOTE — Progress Notes (Signed)
Patient presents for 6 month med check.  She has no specific concerns.  Hyperlipidemia:  Patient is compliant with her statin and fenofibrate, and tries to follow a low cholesterol diet. She is also taking 2 BID of fish oil. Prior to adding the low dose fenofibrate, her TG was 330.  Lab Results  Component Value Date   CHOL 156 04/18/2016   HDL 38 (L) 04/18/2016   LDLCALC 83 04/18/2016   TRIG 174 (H) 04/18/2016   CHOLHDL 4.1 04/18/2016   Hypertension follow-up:  Blood pressures at home are running 120's-130/75. Denies dizziness, headaches, chest pain.  Denies side effects of medications.  Diabetes follow-up:  Blood sugars at home are running 130'-140's in the morning; doesn't check other times of the day.  146 this morning. Admits to eating more bread in the evenings.  Denies hypoglycemia. Denies polydipsia and polyuria.  Last eye exam was 11/12/15.  Patient follows a low sugar diet and checks feet regularly without concerns. She has an "ugly toenail"--left great toe is discolored. She used an OTC clear liquid for toenail fungus in the past without any benefit. Denies ingrowing nail or pain.  Lab Results  Component Value Date   HGBA1C 6.0 12/27/2015   She gets no regular exercise.  She was referred by her OB-GYN to Dr. Adriana Mccallum for colonoscopy but hasn't scheduled this yet. +family history colon cancer.  Immunization History  Administered Date(s) Administered  . Pneumococcal Polysaccharide-23 12/27/2015  . Tdap 12/27/2015   mammo 11/2015  PMH, PSH, SH reviewed  Outpatient Encounter Prescriptions as of 07/03/2016  Medication Sig  . estradiol (ESTRACE) 0.5 MG tablet Take 0.5 mg by mouth daily.  . fenofibrate 54 MG tablet TAKE ONE TABLET BY MOUTH ONCE DAILY  . lisinopril (PRINIVIL,ZESTRIL) 20 MG tablet Take 1 tablet (20 mg total) by mouth daily.  Marland Kitchen lovastatin (MEVACOR) 40 MG tablet Take 1 tablet (40 mg total) by mouth at bedtime.  . metFORMIN (GLUCOPHAGE) 500 MG tablet Take 1  tablet (500 mg total) by mouth 2 (two) times daily with a meal.  . Multiple Vitamins-Minerals (MULTIVITAMIN WITH MINERALS) tablet Take 1 tablet by mouth daily.  . Omega-3 Fatty Acids (FISH OIL) 1000 MG CPDR Take 2 capsules by mouth 2 (two) times daily.  Marland Kitchen VITAMIN D, CHOLECALCIFEROL, PO Take 5,000 Int'l Units by mouth daily.  . [DISCONTINUED] lisinopril (PRINIVIL,ZESTRIL) 20 MG tablet Take 1 tablet (20 mg total) by mouth daily.  . [DISCONTINUED] metFORMIN (GLUCOPHAGE) 500 MG tablet Take 1 tablet (500 mg total) by mouth 2 (two) times daily with a meal.  . [DISCONTINUED] amoxicillin (AMOXIL) 875 MG tablet Take 1 tablet (875 mg total) by mouth 2 (two) times daily.   No facility-administered encounter medications on file as of 07/03/2016.    Allergies  Allergen Reactions  . Codeine Rash    ROS:  no fever, chills, headaches, dizziness, numbness, tingling, URI symptoms, cough, shortness of breath, chest pain, GI or GU complaints. Moods are good.  Full ROS negative, see HPI.  PHYSICAL EXAM:  BP 120/70 (BP Location: Left Arm, Patient Position: Sitting, Cuff Size: Normal)   Pulse 68   Ht '5\' 7"'$  (1.702 m)   Wt 212 lb 12.8 oz (96.5 kg)   BMI 33.33 kg/m   Well appearing, pleasant, obese female in no distress HEENT: PERRL, EOMI, conjunctiva and sclera are clear. OP clear Neck: no lymphadenopathy thyromegaly or carotid bruit Heart: regular rate and rhythm Lungs: clear bilaterally Back: no CVA or spinal tenderness Abdomen: soft, nontender,  no mass Extremities: no edema, 2+ pulses, normal sensation. Left great toenail--discolored, slightly thickened, but has had the medial portion cut away. No evidence of ingrowing nail, nontender. Normal monofilament exam Skin: normal turgor, no rashes Neuro: cranial nerves intact, normal strength, sensation, gait Psych: normal mood, affect, hygiene and grooming  Normal diabetic foot exam  Lab Results  Component Value Date   HGBA1C 6.2 07/03/2016     ASSESSMENT/PLAN:  Diabetes mellitus without complication (HCC) - controlled; encouraged low carb diet, daily exercise, weight loss - Plan: HgB A1c, Microalbumin / creatinine urine ratio, Comprehensive metabolic panel, metFORMIN (GLUCOPHAGE) 500 MG tablet  Mixed hyperlipidemia - borderline TG previously.  Diet has been a little worse, per pt.  Recheck lipids today, adjust meds if needed - Plan: Lipid panel, Comprehensive metabolic panel  Essential hypertension - controlled - Plan: lisinopril (PRINIVIL,ZESTRIL) 20 MG tablet  Diabetes mellitus without complication (HCC) - Plan: HgB A1c, Microalbumin / creatinine urine ratio, Comprehensive metabolic panel, metFORMIN (GLUCOPHAGE) 500 MG tablet   A1c c-met, lipids, urine microalb/Cr   shingrix recommended, risks/side effects reviewed.  To check her insurance.  Reminded to please schedule colonoscopy.

## 2016-07-03 ENCOUNTER — Encounter: Payer: Self-pay | Admitting: Family Medicine

## 2016-07-03 ENCOUNTER — Ambulatory Visit (INDEPENDENT_AMBULATORY_CARE_PROVIDER_SITE_OTHER): Payer: 59 | Admitting: Family Medicine

## 2016-07-03 VITALS — BP 120/70 | HR 68 | Ht 67.0 in | Wt 212.8 lb

## 2016-07-03 DIAGNOSIS — E119 Type 2 diabetes mellitus without complications: Secondary | ICD-10-CM | POA: Diagnosis not present

## 2016-07-03 DIAGNOSIS — I10 Essential (primary) hypertension: Secondary | ICD-10-CM | POA: Diagnosis not present

## 2016-07-03 DIAGNOSIS — E782 Mixed hyperlipidemia: Secondary | ICD-10-CM | POA: Diagnosis not present

## 2016-07-03 LAB — COMPREHENSIVE METABOLIC PANEL
ALT: 23 U/L (ref 6–29)
AST: 20 U/L (ref 10–35)
Albumin: 4.4 g/dL (ref 3.6–5.1)
Alkaline Phosphatase: 41 U/L (ref 33–130)
BILIRUBIN TOTAL: 0.4 mg/dL (ref 0.2–1.2)
BUN: 18 mg/dL (ref 7–25)
CALCIUM: 9.4 mg/dL (ref 8.6–10.4)
CHLORIDE: 102 mmol/L (ref 98–110)
CO2: 28 mmol/L (ref 20–31)
Creat: 0.72 mg/dL (ref 0.50–1.05)
GLUCOSE: 105 mg/dL — AB (ref 65–99)
POTASSIUM: 4.5 mmol/L (ref 3.5–5.3)
Sodium: 137 mmol/L (ref 135–146)
Total Protein: 7.5 g/dL (ref 6.1–8.1)

## 2016-07-03 LAB — POCT GLYCOSYLATED HEMOGLOBIN (HGB A1C): Hemoglobin A1C: 6.2

## 2016-07-03 LAB — LIPID PANEL
CHOL/HDL RATIO: 3.8 ratio (ref <5.0)
CHOLESTEROL: 169 mg/dL (ref <200)
HDL: 45 mg/dL — ABNORMAL LOW (ref >50)
LDL Cholesterol: 73 mg/dL (ref <100)
Triglycerides: 254 mg/dL — ABNORMAL HIGH (ref <150)
VLDL: 51 mg/dL — AB (ref <30)

## 2016-07-03 MED ORDER — LISINOPRIL 20 MG PO TABS: 20.0000 mg | ORAL_TABLET | Freq: Every day | ORAL | 1 refills | Status: DC

## 2016-07-03 MED ORDER — METFORMIN HCL 500 MG PO TABS
500.0000 mg | ORAL_TABLET | Freq: Two times a day (BID) | ORAL | 1 refills | Status: DC
Start: 2016-07-03 — End: 2017-01-03

## 2016-07-03 NOTE — Patient Instructions (Addendum)
Please cut back on the carbs in the diet. Try and get at least 150 minutes of aerobic activity each week as discussed (in 10-15 minute intervals, if needed).  I recommend getting the new shingles vaccine (Shingrix) when available. You will need to check with your insurance to see if it is covered.  It is a series of 2 injections, spaced 2 months apart.  Please call to schedule your colonoscopy.  Continue your current medications.

## 2016-07-04 LAB — MICROALBUMIN / CREATININE URINE RATIO
CREATININE, URINE: 59 mg/dL (ref 20–320)
MICROALB UR: 0.3 mg/dL
Microalb Creat Ratio: 5 mcg/mg creat (ref <30)

## 2016-07-04 MED ORDER — FENOFIBRATE 54 MG PO TABS: 54.0000 mg | ORAL_TABLET | Freq: Every day | ORAL | 0 refills | Status: DC

## 2016-07-04 MED ORDER — LOVASTATIN 40 MG PO TABS: 40.0000 mg | ORAL_TABLET | Freq: Every day | ORAL | 0 refills | Status: DC

## 2016-07-05 ENCOUNTER — Other Ambulatory Visit: Payer: Self-pay | Admitting: *Deleted

## 2016-07-05 DIAGNOSIS — E782 Mixed hyperlipidemia: Secondary | ICD-10-CM

## 2016-10-04 ENCOUNTER — Other Ambulatory Visit: Payer: Self-pay

## 2016-10-10 ENCOUNTER — Other Ambulatory Visit: Payer: 59

## 2016-10-10 DIAGNOSIS — E782 Mixed hyperlipidemia: Secondary | ICD-10-CM

## 2016-10-10 LAB — LIPID PANEL
CHOLESTEROL: 158 mg/dL (ref <200)
HDL: 40 mg/dL — ABNORMAL LOW (ref >50)
LDL Cholesterol: 73 mg/dL (ref <100)
Total CHOL/HDL Ratio: 4 Ratio (ref <5.0)
Triglycerides: 224 mg/dL — ABNORMAL HIGH (ref <150)
VLDL: 45 mg/dL — ABNORMAL HIGH (ref <30)

## 2016-10-11 ENCOUNTER — Ambulatory Visit (INDEPENDENT_AMBULATORY_CARE_PROVIDER_SITE_OTHER): Payer: 59 | Admitting: Family Medicine

## 2016-10-11 ENCOUNTER — Encounter: Payer: Self-pay | Admitting: Family Medicine

## 2016-10-11 VITALS — BP 102/70 | HR 68 | Ht 67.0 in | Wt 211.0 lb

## 2016-10-11 DIAGNOSIS — E119 Type 2 diabetes mellitus without complications: Secondary | ICD-10-CM | POA: Diagnosis not present

## 2016-10-11 DIAGNOSIS — Z5181 Encounter for therapeutic drug level monitoring: Secondary | ICD-10-CM | POA: Diagnosis not present

## 2016-10-11 DIAGNOSIS — E782 Mixed hyperlipidemia: Secondary | ICD-10-CM | POA: Diagnosis not present

## 2016-10-11 DIAGNOSIS — I10 Essential (primary) hypertension: Secondary | ICD-10-CM | POA: Diagnosis not present

## 2016-10-11 MED ORDER — ROSUVASTATIN CALCIUM 20 MG PO TABS: 20.0000 mg | ORAL_TABLET | Freq: Every day | ORAL | 2 refills | Status: DC

## 2016-10-11 NOTE — Progress Notes (Signed)
Chief Complaint  Patient presents with  . Follow-up    on blood work, cholesterol.    Patient was asked to come in to discuss her diet/medications, as she has persistently elevated TG. She is currently on 40 mg of lovastatin and low dose fenofibrate.  Since her last visit, she changed to 1/2 and 1/2 tea (or unsweet), cut out sweet tea.  Doesn't drink soda. She continues to take fish oil 2 BID. Still not getting much exercise. Changed to wheat bread, no longer eating bread as often as at last visit. Sugars are running a little higher in the morning. 137 yesterday, usually 140-145. Lab Results  Component Value Date   HGBA1C 6.2 07/03/2016   She denies any side effects from her current meds.  PMH, PSH, SH reviewed and updated.  Her mother recently fell and broke her hip.  Outpatient Encounter Prescriptions as of 10/11/2016  Medication Sig  . estradiol (ESTRACE) 0.5 MG tablet Take 0.5 mg by mouth daily.  Marland Kitchen lisinopril (PRINIVIL,ZESTRIL) 20 MG tablet Take 1 tablet (20 mg total) by mouth daily.  . metFORMIN (GLUCOPHAGE) 500 MG tablet Take 1 tablet (500 mg total) by mouth 2 (two) times daily with a meal.  . Multiple Vitamins-Minerals (MULTIVITAMIN WITH MINERALS) tablet Take 1 tablet by mouth daily.  . Omega-3 Fatty Acids (FISH OIL) 1000 MG CPDR Take 2 capsules by mouth 2 (two) times daily.  Marland Kitchen VITAMIN D, CHOLECALCIFEROL, PO Take 5,000 Int'l Units by mouth daily.  . [DISCONTINUED] fenofibrate 54 MG tablet Take 1 tablet (54 mg total) by mouth daily.  . [DISCONTINUED] lovastatin (MEVACOR) 40 MG tablet Take 1 tablet (40 mg total) by mouth at bedtime.  . rosuvastatin (CRESTOR) 20 MG tablet Take 1 tablet (20 mg total) by mouth daily.   No facility-administered encounter medications on file as of 10/11/2016.    (not taking crestor prior to today's visit)  Allergies  Allergen Reactions  . Codeine Rash   ROS: no fever, chills, headaches, dizziness, URI symptoms, chest pain, palpitations,  shortness of breath, myalgias, bleeding, bruising, rash, mood changes or other concerns.  PHYSICAL EXAM:  BP 102/70 (BP Location: Left Arm, Patient Position: Sitting, Cuff Size: Normal)   Pulse 68   Ht '5\' 7"'  (1.702 m)   Wt 211 lb (95.7 kg)   BMI 33.05 kg/m   Wt Readings from Last 3 Encounters:  10/11/16 211 lb (95.7 kg)  07/03/16 212 lb 12.8 oz (96.5 kg)  03/14/16 210 lb 12.8 oz (95.6 kg)   Well appearing, pleasant female, in good spirits, in no distress   Lab Results  Component Value Date   CHOL 158 10/10/2016   HDL 40 (L) 10/10/2016   LDLCALC 73 10/10/2016   TRIG 224 (H) 10/10/2016   CHOLHDL 4.0 10/10/2016    ASSESSMENT/PLAN:  Mixed hyperlipidemia - counseled re: diet and need for keeping sugars down. Change to Crestor 56m (stop lovastatin, fenofibrate); cont fish oil. recheck 3 mos - Plan: rosuvastatin (CRESTOR) 20 MG tablet, Comprehensive metabolic panel, Lipid panel  Diabetes mellitus without complication (HCC) - controlled per last A1c, some higher sugars recently. Encouraged daily exercise, weight loss, proper diet - Plan: Comprehensive metabolic panel, Hemoglobin A1c, TSH  Essential hypertension - controlled - Plan: Comprehensive metabolic panel  Medication monitoring encounter - Plan: Comprehensive metabolic panel, Lipid panel, Hemoglobin A1c  25 min visit, all spent counseling   Stop both the fenofibrate and the lovastatin. Change to taking Crestor 1 tablet daily. If you have significant side effects (  muscle aches) try taking coenzyme Q10 along with it, and if that doesn't help, then decrease the dose of Crestor to 1/2 tablet daily. Continue to work on limiting the sweets, sugar and fried foods in the diet. Continue to work on weight loss. Please try and get at least 150 minutes of aerobic exercise each week--this helps keep the sugars down, which in turn helps keep the triglycerides down (and also is good for the heart, weight). Continue the fish oil of  3000-4000 mg daily.   If crestor generic is not preferred or too expensive, the next recommended option is atorvastatin 40m.  Return as scheduled in October with labs prior  c-met, lipid, A1c TSH

## 2016-10-11 NOTE — Patient Instructions (Signed)
   Stop both the fenofibrate and the lovastatin. Change to taking Crestor 1 tablet daily. If you have significant side effects (muscle aches) try taking coenzyme Q10 along with it, and if that doesn't help, then decrease the dose of Crestor to 1/2 tablet daily. Continue to work on limiting the sweets, sugar and fried foods in the diet. Continue to work on weight loss. Please try and get at least 150 minutes of aerobic exercise each week--this helps keep the sugars down, which in turn helps keep the triglycerides down (and also is good for the heart, weight). Continue the fish oil of 3000-4000 mg daily.   If crestor generic is not preferred or too expensive, the next recommended option is atorvastatin 40mg .  Return as scheduled in October with labs prior

## 2016-10-12 ENCOUNTER — Encounter: Payer: Self-pay | Admitting: Family Medicine

## 2016-12-21 LAB — HM MAMMOGRAPHY

## 2016-12-29 ENCOUNTER — Other Ambulatory Visit: Payer: 59

## 2016-12-29 DIAGNOSIS — Z5181 Encounter for therapeutic drug level monitoring: Secondary | ICD-10-CM

## 2016-12-29 DIAGNOSIS — E119 Type 2 diabetes mellitus without complications: Secondary | ICD-10-CM

## 2016-12-29 DIAGNOSIS — I10 Essential (primary) hypertension: Secondary | ICD-10-CM

## 2016-12-29 DIAGNOSIS — E782 Mixed hyperlipidemia: Secondary | ICD-10-CM

## 2016-12-30 LAB — COMPREHENSIVE METABOLIC PANEL
AG Ratio: 1.4 (calc) (ref 1.0–2.5)
ALT: 28 U/L (ref 6–29)
AST: 26 U/L (ref 10–35)
Albumin: 4.2 g/dL (ref 3.6–5.1)
Alkaline phosphatase (APISO): 56 U/L (ref 33–130)
BUN: 14 mg/dL (ref 7–25)
CHLORIDE: 103 mmol/L (ref 98–110)
CO2: 26 mmol/L (ref 20–32)
CREATININE: 0.75 mg/dL (ref 0.50–1.05)
Calcium: 9.3 mg/dL (ref 8.6–10.4)
GLOBULIN: 3 g/dL (ref 1.9–3.7)
GLUCOSE: 123 mg/dL — AB (ref 65–99)
POTASSIUM: 4.5 mmol/L (ref 3.5–5.3)
SODIUM: 138 mmol/L (ref 135–146)
Total Bilirubin: 0.5 mg/dL (ref 0.2–1.2)
Total Protein: 7.2 g/dL (ref 6.1–8.1)

## 2016-12-30 LAB — LIPID PANEL
CHOL/HDL RATIO: 3 (calc) (ref <5.0)
Cholesterol: 130 mg/dL (ref <200)
HDL: 43 mg/dL — AB (ref >50)
LDL Cholesterol (Calc): 60 mg/dL (calc)
NON-HDL CHOLESTEROL (CALC): 87 mg/dL (ref <130)
Triglycerides: 196 mg/dL — ABNORMAL HIGH (ref <150)

## 2016-12-30 LAB — HEMOGLOBIN A1C
Hgb A1c MFr Bld: 6.3 % of total Hgb — ABNORMAL HIGH (ref <5.7)
Mean Plasma Glucose: 134 (calc)
eAG (mmol/L): 7.4 (calc)

## 2016-12-30 LAB — TSH: TSH: 2.37 m[IU]/L

## 2017-01-01 ENCOUNTER — Other Ambulatory Visit: Payer: 59

## 2017-01-02 NOTE — Progress Notes (Signed)
Chief Complaint  Patient presents with  . Diabetes    non fasting med check, labs already done.    Patient presents for 6 month med check.  She has no specific concerns.  Hyperlipidemia: Patient had her medication changed 3 months ago from lovastatin and fenofibrate, to Crestor (rosuvastatin) 104m.  She is compliant with medication and denies side effects . She had slight aches from the lovastatin, which she doesn't have with Crestor at all. She tries to follow a low cholesterol diet. She continues to take 2 BID of fish oil. She had labs prior to her visit today--see below. Lipids on July, prior to changing from lovastatin to rosuvastatin: Cholesterol <200 mg/dL 158   Triglycerides <150 mg/dL 224    HDL >50 mg/dL 40    Total CHOL/HDL Ratio <5.0 Ratio 4.0   VLDL <30 mg/dL 45    LDL Cholesterol <100 mg/dL 73     Hypertension follow-up: She hasn't been checking her blood pressure at home. Denies dizziness, headaches, chest pain. Denies side effects of medications.  Diabetes follow-up: Blood sugars at home are running 130's in the morning; doesn't check other times of the day. Once checked after work and it was 110. She hasn't been eating as much sweets, hasn't had the desire for dessert. Denies hypoglycemia. Denies polydipsia and polyuria. Last eye exam was 11/12/15--knows she needs to schedule. Patient follows a low sugar diet and checks feet regularly without concerns. She still an "ugly toenail"--left great toe is discolored. She used an OTC clear liquid for toenail fungus in the past without any benefit.Denies ingrowing nail or pain.  She gets no regular exercise.  Health Maintenance items: Last mammogram: last week at Dr. HMalachi Carloffice (also had her GYN exam). She was referred by her OB-GYN toDr. Mann/Hung for colonoscopy but hasn't scheduled this yet. +family history colon cancer. She was reminded again at her recent GYN visit.  Immunization History  Administered Date(s)  Administered  . Pneumococcal Polysaccharide-23 12/27/2015  . Tdap 12/27/2015   PMH, PSH, SH reviewed  Outpatient Encounter Prescriptions as of 01/03/2017  Medication Sig  . estradiol (ESTRACE) 0.5 MG tablet Take 0.5 mg by mouth daily.  .Marland Kitchenlisinopril (PRINIVIL,ZESTRIL) 20 MG tablet Take 1 tablet (20 mg total) by mouth daily.  . metFORMIN (GLUCOPHAGE) 500 MG tablet Take 1 tablet (500 mg total) by mouth 2 (two) times daily with a meal.  . Multiple Vitamins-Minerals (MULTIVITAMIN WITH MINERALS) tablet Take 1 tablet by mouth daily.  . Omega-3 Fatty Acids (FISH OIL) 1000 MG CPDR Take 2 capsules by mouth 2 (two) times daily.  . rosuvastatin (CRESTOR) 20 MG tablet Take 1 tablet (20 mg total) by mouth daily.  .Marland KitchenVITAMIN D, CHOLECALCIFEROL, PO Take 5,000 Int'l Units by mouth daily.  . [DISCONTINUED] lisinopril (PRINIVIL,ZESTRIL) 20 MG tablet Take 1 tablet (20 mg total) by mouth daily.  . [DISCONTINUED] metFORMIN (GLUCOPHAGE) 500 MG tablet Take 1 tablet (500 mg total) by mouth 2 (two) times daily with a meal.  . [DISCONTINUED] rosuvastatin (CRESTOR) 20 MG tablet Take 1 tablet (20 mg total) by mouth daily.   No facility-administered encounter medications on file as of 01/03/2017.    Allergies  Allergen Reactions  . Codeine Rash    ROS:  no fever, chills, headaches, dizziness, numbness, tingling, URI symptoms, cough, shortness of breath, chest pain, palpitations, GI or GU complaints. Moods are good.  Full ROS negative, see HPI.   PHYSICAL EXAM:  BP 128/78 (BP Location: Left Arm, Patient Position: Sitting,  Cuff Size: Normal)   Pulse 72   Ht '5\' 7"'  (1.702 m)   Wt 213 lb 6.4 oz (96.8 kg)   BMI 33.42 kg/m   Wt Readings from Last 3 Encounters:  01/03/17 213 lb 6.4 oz (96.8 kg)  10/11/16 211 lb (95.7 kg)  07/03/16 212 lb 12.8 oz (96.5 kg)    Well appearing, pleasant, obese female in no distress HEENT: PERRL, EOMI, conjunctiva and sclera are clear. OP clear, nose is clear, sinuses  nontender Neck: no lymphadenopathy thyromegaly or carotid bruit Heart: regular rate and rhythm Lungs: clear bilaterally Back: no CVA or spinal tenderness Abdomen: soft, nontender, no mass Extremities: no edema, 2+ pulses, normal sensation. Left great toenail--discolored, slightly thickened. No evidence of ingrowing nail, nontender. Normal monofilament exam Skin: normal turgor, no rashes Neuro: cranial nerves intact, normal strength, sensation, gait Psych: normal mood, affect, hygiene and grooming  Normal diabetic foot exam  Lab Results  Component Value Date   HGBA1C 6.3 (H) 12/29/2016     Chemistry      Component Value Date/Time   NA 138 12/29/2016 0754   K 4.5 12/29/2016 0754   CL 103 12/29/2016 0754   CO2 26 12/29/2016 0754   BUN 14 12/29/2016 0754   CREATININE 0.75 12/29/2016 0754      Component Value Date/Time   CALCIUM 9.3 12/29/2016 0754   ALKPHOS 41 07/03/2016 0927   AST 26 12/29/2016 0754   ALT 28 12/29/2016 0754   BILITOT 0.5 12/29/2016 0754     Fasting glucose 123  Lab Results  Component Value Date   CHOL 130 12/29/2016   HDL 43 (L) 12/29/2016   LDLCALC 73 10/10/2016   TRIG 196 (H) 12/29/2016   CHOLHDL 3.0 12/29/2016   Lab Results  Component Value Date   TSH 2.37 12/29/2016    ASSESSMENT/PLAN:  Diabetes mellitus without complication (HCC) - controlled; encouraged daily exercise and weight loss - Plan: metFORMIN (GLUCOPHAGE) 500 MG tablet  Mixed hyperlipidemia - LDL at goal; TG remains elevated, but now under 200. Continue Crestor and fish oil. Reviewed diet/exercise. Recheck 6 mos and adjust meds if TG remains high - Plan: rosuvastatin (CRESTOR) 20 MG tablet  Hyperlipidemia associated with type 2 diabetes mellitus ([place])  Essential hypertension - controlled - Plan: lisinopril (PRINIVIL,ZESTRIL) 20 MG tablet  Family history of colon cancer - past due for colonoscopy; encouraged to schedule with GI  Need for influenza vaccination - Plan: Flu  Vaccine QUAD 6+ mos PF IM (Fluarix Quad PF)   Flu shot given Shingrix recommended  Past due for colonoscopy--reminded to schedule Past due for yearly diabetic eye exam, encouraged to schedule  Consider further increasing Crestor vs improve diet/exercise/recheck, vs add back fenofibrate at low dose.   We will try further dietary trial, no change in med at this time; adjust if still not at goal at f/u visit.  F/u 6 mos--with fasting labs prior A1c, urine microalbumin, lipids, c-met Will also check CBC and Vitamin D (not checked in years, no labs done by GYN)

## 2017-01-03 ENCOUNTER — Encounter: Payer: Self-pay | Admitting: Family Medicine

## 2017-01-03 ENCOUNTER — Ambulatory Visit (INDEPENDENT_AMBULATORY_CARE_PROVIDER_SITE_OTHER): Payer: 59 | Admitting: Family Medicine

## 2017-01-03 VITALS — BP 128/78 | HR 72 | Ht 67.0 in | Wt 213.4 lb

## 2017-01-03 DIAGNOSIS — E1169 Type 2 diabetes mellitus with other specified complication: Secondary | ICD-10-CM | POA: Diagnosis not present

## 2017-01-03 DIAGNOSIS — I10 Essential (primary) hypertension: Secondary | ICD-10-CM

## 2017-01-03 DIAGNOSIS — Z23 Encounter for immunization: Secondary | ICD-10-CM

## 2017-01-03 DIAGNOSIS — Z5181 Encounter for therapeutic drug level monitoring: Secondary | ICD-10-CM | POA: Diagnosis not present

## 2017-01-03 DIAGNOSIS — E782 Mixed hyperlipidemia: Secondary | ICD-10-CM

## 2017-01-03 DIAGNOSIS — R5383 Other fatigue: Secondary | ICD-10-CM | POA: Diagnosis not present

## 2017-01-03 DIAGNOSIS — E119 Type 2 diabetes mellitus without complications: Secondary | ICD-10-CM | POA: Diagnosis not present

## 2017-01-03 DIAGNOSIS — Z800 Family history of malignant neoplasm of digestive organs: Secondary | ICD-10-CM

## 2017-01-03 DIAGNOSIS — E785 Hyperlipidemia, unspecified: Secondary | ICD-10-CM

## 2017-01-03 MED ORDER — LISINOPRIL 20 MG PO TABS: 20.0000 mg | ORAL_TABLET | Freq: Every day | ORAL | 1 refills | Status: DC

## 2017-01-03 MED ORDER — METFORMIN HCL 500 MG PO TABS: 500.0000 mg | ORAL_TABLET | Freq: Two times a day (BID) | ORAL | 1 refills | Status: DC

## 2017-01-03 MED ORDER — ROSUVASTATIN CALCIUM 20 MG PO TABS: 20.0000 mg | ORAL_TABLET | Freq: Every day | ORAL | 1 refills | Status: DC

## 2017-01-03 NOTE — Patient Instructions (Addendum)
I recommend getting the new shingles vaccine (Shingrix). You will need to check with your insurance to see if it is covered, and if covered by Medicare Part D, you need to get from the pharmacy rather than our office.  It is a series of 2 injections, spaced 2 months apart.  We gave you a flu shot today.  Please schedule your diabetic eye exam and call the GI office to schedule your colonoscopy.  It is recommended that you get at least 30 minutes of aerobic exercise at least 5 days/week (for weight loss, you may need as much as 60-90 minutes). This can be any activity that gets your heart rate up. This can be divided in 10-15 minute intervals if needed, but try and build up your endurance at least once a week.  Weight bearing exercise is also recommended twice weekly.  Continue your current medications and fish oil.

## 2017-02-05 LAB — HM DIABETES EYE EXAM

## 2017-02-12 ENCOUNTER — Encounter: Payer: Self-pay | Admitting: Family Medicine

## 2017-07-02 ENCOUNTER — Other Ambulatory Visit: Payer: 59

## 2017-07-02 DIAGNOSIS — Z5181 Encounter for therapeutic drug level monitoring: Secondary | ICD-10-CM

## 2017-07-02 DIAGNOSIS — E119 Type 2 diabetes mellitus without complications: Secondary | ICD-10-CM

## 2017-07-02 DIAGNOSIS — E782 Mixed hyperlipidemia: Secondary | ICD-10-CM

## 2017-07-02 DIAGNOSIS — I10 Essential (primary) hypertension: Secondary | ICD-10-CM

## 2017-07-02 DIAGNOSIS — R5383 Other fatigue: Secondary | ICD-10-CM

## 2017-07-02 NOTE — Addendum Note (Signed)
Addended by: Debbrah AlarSMITH, Hazen Brumett F on: 07/02/2017 08:37 AM   Modules accepted: Orders

## 2017-07-03 LAB — COMPREHENSIVE METABOLIC PANEL
ALK PHOS: 55 IU/L (ref 39–117)
ALT: 24 IU/L (ref 0–32)
AST: 22 IU/L (ref 0–40)
Albumin/Globulin Ratio: 1.5 (ref 1.2–2.2)
Albumin: 4.3 g/dL (ref 3.5–5.5)
BILIRUBIN TOTAL: 0.4 mg/dL (ref 0.0–1.2)
BUN/Creatinine Ratio: 23 (ref 9–23)
BUN: 15 mg/dL (ref 6–24)
CHLORIDE: 102 mmol/L (ref 96–106)
CO2: 24 mmol/L (ref 20–29)
Calcium: 9.3 mg/dL (ref 8.7–10.2)
Creatinine, Ser: 0.65 mg/dL (ref 0.57–1.00)
GFR calc non Af Amer: 102 mL/min/{1.73_m2} (ref >59)
GFR, EST AFRICAN AMERICAN: 117 mL/min/{1.73_m2} (ref >59)
Globulin, Total: 2.8 g/dL (ref 1.5–4.5)
Glucose: 123 mg/dL — ABNORMAL HIGH (ref 65–99)
POTASSIUM: 4.8 mmol/L (ref 3.5–5.2)
Sodium: 141 mmol/L (ref 134–144)
TOTAL PROTEIN: 7.1 g/dL (ref 6.0–8.5)

## 2017-07-03 LAB — LIPID PANEL
CHOL/HDL RATIO: 2.9 ratio (ref 0.0–4.4)
CHOLESTEROL TOTAL: 129 mg/dL (ref 100–199)
HDL: 44 mg/dL (ref >39)
LDL CALC: 42 mg/dL (ref 0–99)
TRIGLYCERIDES: 213 mg/dL — AB (ref 0–149)
VLDL CHOLESTEROL CAL: 43 mg/dL — AB (ref 5–40)

## 2017-07-03 LAB — HEMOGLOBIN A1C
Est. average glucose Bld gHb Est-mCnc: 143 mg/dL
Hgb A1c MFr Bld: 6.6 % — ABNORMAL HIGH (ref 4.8–5.6)

## 2017-07-03 LAB — CBC WITH DIFFERENTIAL/PLATELET
Basophils Absolute: 0 10*3/uL (ref 0.0–0.2)
Basos: 0 %
EOS (ABSOLUTE): 0.3 10*3/uL (ref 0.0–0.4)
EOS: 4 %
HEMATOCRIT: 38 % (ref 34.0–46.6)
HEMOGLOBIN: 12.8 g/dL (ref 11.1–15.9)
IMMATURE GRANS (ABS): 0 10*3/uL (ref 0.0–0.1)
IMMATURE GRANULOCYTES: 0 %
LYMPHS ABS: 2.3 10*3/uL (ref 0.7–3.1)
LYMPHS: 31 %
MCH: 29 pg (ref 26.6–33.0)
MCHC: 33.7 g/dL (ref 31.5–35.7)
MCV: 86 fL (ref 79–97)
MONOCYTES: 8 %
Monocytes Absolute: 0.6 10*3/uL (ref 0.1–0.9)
Neutrophils Absolute: 4.3 10*3/uL (ref 1.4–7.0)
Neutrophils: 57 %
Platelets: 282 10*3/uL (ref 150–379)
RBC: 4.42 x10E6/uL (ref 3.77–5.28)
RDW: 13.4 % (ref 12.3–15.4)
WBC: 7.5 10*3/uL (ref 3.4–10.8)

## 2017-07-03 LAB — VITAMIN D 25 HYDROXY (VIT D DEFICIENCY, FRACTURES): Vit D, 25-Hydroxy: 50.9 ng/mL (ref 30.0–100.0)

## 2017-07-03 NOTE — Progress Notes (Signed)
Chief Complaint  Patient presents with  . Hypertension    nonfasting med check, labs already done. Is having some right shoulder pain off and on since Feb 1st.     Patient presents for 6 month med check.   She is complaining of right shoulder pain off/on since 2/1. She wakes up with pain, takes ibuprofen and is fine the rest of the day.  She thinks she slept with her arm over her head one night, which triggered the pain.  Hurts to raise her arms just in the mornings, fine later in the day. She takes 2 ibuprofen, not even every day. Currently denies pain.    Hyperlipidemia: Patient has been on Crestor 79m since 09/2016 (changed from lovastatin and fenofibrate).  She is compliant with medication and denies side effects.  (She previously had slight aches from lovastatin). She tries to follow a low cholesterol diet. She continues to take 2 BID of fish oil. She had labs prior to her visit today--see below. She still drinks 1 glass of sweet tea with dinner.  No other sugary beverages, just 1 soda/week. +sweets after dinner most nights.  Some fried foods, 3-4x/week.  Hypertension follow-up: She hasn't been checking her blood pressure at home. Denies dizziness, headaches, chest pain. Denies side effects of medications. She is compliant in taking lisinopril 229mdaily.  Diabetes follow-up: She is compliant with taking metformin 50048mID and denies side effects. Blood sugars at home are running 130's, fasting Denies hypoglycemia. Tries to follow a lowfat, low sugar diet--see above. Denies polydipsia and polyuria, numbness, tingling, neuropathy. [place]. She still an "ugly toenail"--left great toe is discolored. She used an OTC clear liquid for toenail fungus in the past without any benefit.Denies ingrowing nail or pain.  She gets no regular exercise.  Health Maintenance items: She had annual exam with Dr.  HorPhilis Pique 11/2016. Had mammo the same day.  Colon cancer screening:  Previously referred to GI (Mann/Hung) by her GYN, +FH colon cancer, hasn't had yet.   Immunization History  Administered Date(s) Administered  . Influenza,inj,Quad PF,6+ Mos 01/03/2017  . Pneumococcal Polysaccharide-23 12/27/2015  . Tdap 12/27/2015    PMH, PSH, SH reviewed  Outpatient Encounter Medications as of 07/04/2017  Medication Sig  . estradiol (ESTRACE) 0.5 MG tablet Take 0.5 mg by mouth daily.  . lMarland Kitchensinopril (PRINIVIL,ZESTRIL) 20 MG tablet Take 1 tablet (20 mg total) by mouth daily.  . metFORMIN (GLUCOPHAGE) 500 MG tablet Take 1 tablet (500 mg total) by mouth 2 (two) times daily with a meal.  . Multiple Vitamins-Minerals (MULTIVITAMIN WITH MINERALS) tablet Take 1 tablet by mouth daily.  . Omega-3 Fatty Acids (FISH OIL) 1000 MG CPDR Take 2 capsules by mouth 2 (two) times daily.  . rosuvastatin (CRESTOR) 20 MG tablet Take 1 tablet (20 mg total) by mouth daily.  . VMarland KitchenTAMIN D, CHOLECALCIFEROL, PO Take 5,000 Int'l Units by mouth daily.  . [DISCONTINUED] lisinopril (PRINIVIL,ZESTRIL) 20 MG tablet Take 1 tablet (20 mg total) by mouth daily.  . [DISCONTINUED] metFORMIN (GLUCOPHAGE) 500 MG tablet Take 1 tablet (500 mg total) by mouth 2 (two) times daily with a meal.  . [DISCONTINUED] rosuvastatin (CRESTOR) 20 MG tablet Take 1 tablet (20 mg total) by mouth daily.   No facility-administered encounter medications on file as of 07/04/2017.    Allergies  Allergen Reactions  . Codeine Rash    ROS: no fever, chills, headaches, dizziness, numbness,  tingling, URI symptoms, cough, shortness of breath, chest pain, palpitations, GI or GU complaints. Moods are good. +right shoulder pain per HPI.  Rest of ROS negative   PHYSICAL EXAM:  BP 118/72   Pulse 76   Ht 5' 7.25" (1.708 m)   Wt 217 lb 6.4 oz (98.6 kg)   BMI 33.80 kg/m    Wt Readings from Last 3 Encounters:  01/03/17 213 lb 6.4 oz (96.8 kg)  10/11/16 211 lb  (95.7 kg)  07/03/16 212 lb 12.8 oz (96.5 kg)    Well appearing, pleasant, obese female in no distress Tearful in discussing her mother-in-law (who passed away) and her niece (who remains very sad about this)  HEENT: PERRL, EOMI, conjunctiva and sclera are clear. OP clear, nose is clear, sinuses nontender Neck: no lymphadenopathy thyromegaly or carotid bruit Heart: regular rate and rhythm Lungs: clear bilaterally Back: no CVA or spinal tenderness Abdomen: soft, nontender, no mass Extremities: no edema, 2+ pulses, normal sensation. FROM right shoulder.  Area of discomfort is at [facility] joint on right, but nontender.  RC strength is normal, no pain. Left great toenail--discolored, slightly thickened. No evidence of ingrowing nail, nontender. Normal monofilament exam Skin: normal turgor, no rashes Neuro: cranial nerves intact, normal strength, sensation, gait Psych: normal mood, affect, hygiene and grooming   Lab Results  Component Value Date   HGBA1C 6.6 (H) 07/02/2017     Chemistry      Component Value Date/Time   NA 141 07/02/2017 0843   K 4.8 07/02/2017 0843   CL 102 07/02/2017 0843   CO2 24 07/02/2017 0843   BUN 15 07/02/2017 0843   CREATININE 0.65 07/02/2017 0843   CREATININE 0.75 12/29/2016 0754      Component Value Date/Time   CALCIUM 9.3 07/02/2017 0843   ALKPHOS 55 07/02/2017 0843   AST 22 07/02/2017 0843   ALT 24 07/02/2017 0843   BILITOT 0.4 07/02/2017 0843     Fasting glucose 123 Vitamin D-OH 50.9  Lab Results  Component Value Date   WBC 7.5 07/02/2017   HGB 12.8 07/02/2017   HCT 38.0 07/02/2017   MCV 86 07/02/2017   PLT 282 07/02/2017   Lab Results  Component Value Date   CHOL 129 07/02/2017   HDL 44 07/02/2017   LDLCALC 42 07/02/2017   TRIG 213 (H) 07/02/2017   CHOLHDL 2.9 07/02/2017    ASSESSMENT/PLAN:  Hyperlipidemia associated with type 2 diabetes mellitus (HCC)  Mixed hyperlipidemia - LDL at goal; TG remains elevated, can improve diet  some. Cont Crestor. Cont fish oil. Cut back fried foods, sugar; exercise, wt loss - Plan: rosuvastatin (CRESTOR) 20 MG tablet  Essential hypertension - controlled, continue lisinopril - Plan: lisinopril (PRINIVIL,ZESTRIL) 20 MG tablet  Family history of colon cancer - in grandparent. past due for baseline colonoscopy. She will schedule  Controlled type 2 diabetes mellitus with complication, without long-term current use of insulin ([place]) - encouraged daily exercise, weight loss, proper diet. Continue metformin  Essential hypertension - controlled - Plan: lisinopril (PRINIVIL,ZESTRIL) 20 MG tablet  Diabetes mellitus without complication (HCC) - controlled; encouraged daily exercise and weight loss - Plan: metFORMIN (GLUCOPHAGE) 500 MG tablet  Mixed hyperlipidemia - Plan: rosuvastatin (CRESTOR) 20 MG tablet  Acute pain of right shoulder - normal exam. Suspect some AC arthritis  LDL at goal, elevated TG. Reviewed diet--cut back on sweets, sugary drinks, fried foods, more fruits/vegetables. Cont 13m Crestor  Shingrix recommended--not covered per pt. Advised to check back at some point,  as that may change.  Past due for colonoscopy--reminded to schedule   F/u 6 mos, with fasting labs prior--A1c, c-met, lipid, urine microalb  Please get the diabetic eye exam done 02/05/17 [facility]--[facility].  Their phone # is [phone] Abstract mammo date (did have at Dr. Malachi Carl, visit in September, scanned in chart)

## 2017-07-04 ENCOUNTER — Encounter: Payer: Self-pay | Admitting: *Deleted

## 2017-07-04 ENCOUNTER — Encounter: Payer: Self-pay | Admitting: Family Medicine

## 2017-07-04 ENCOUNTER — Ambulatory Visit: Payer: 59 | Admitting: Family Medicine

## 2017-07-04 VITALS — BP 118/72 | HR 76 | Ht 67.25 in | Wt 217.4 lb

## 2017-07-04 DIAGNOSIS — M25511 Pain in right shoulder: Secondary | ICD-10-CM | POA: Diagnosis not present

## 2017-07-04 DIAGNOSIS — E1169 Type 2 diabetes mellitus with other specified complication: Secondary | ICD-10-CM | POA: Diagnosis not present

## 2017-07-04 DIAGNOSIS — E119 Type 2 diabetes mellitus without complications: Secondary | ICD-10-CM

## 2017-07-04 DIAGNOSIS — E785 Hyperlipidemia, unspecified: Secondary | ICD-10-CM | POA: Diagnosis not present

## 2017-07-04 DIAGNOSIS — Z800 Family history of malignant neoplasm of digestive organs: Secondary | ICD-10-CM

## 2017-07-04 DIAGNOSIS — E118 Type 2 diabetes mellitus with unspecified complications: Secondary | ICD-10-CM | POA: Diagnosis not present

## 2017-07-04 DIAGNOSIS — I10 Essential (primary) hypertension: Secondary | ICD-10-CM

## 2017-07-04 DIAGNOSIS — E782 Mixed hyperlipidemia: Secondary | ICD-10-CM

## 2017-07-04 MED ORDER — LISINOPRIL 20 MG PO TABS: 20.0000 mg | ORAL_TABLET | Freq: Every day | ORAL | 1 refills | Status: DC

## 2017-07-04 MED ORDER — METFORMIN HCL 500 MG PO TABS: 500.0000 mg | ORAL_TABLET | Freq: Two times a day (BID) | ORAL | 1 refills | Status: DC

## 2017-07-04 MED ORDER — ROSUVASTATIN CALCIUM 20 MG PO TABS: 20.0000 mg | ORAL_TABLET | Freq: Every day | ORAL | 1 refills | Status: DC

## 2017-07-04 NOTE — Patient Instructions (Addendum)
You had your TdaP in October 2017.  Please contact Dr. Ronnald NianMann/Hung's office and schedule a visit to discuss and schedule your colonoscopy.  Cut back on the fried foods (to just 1-2x/week at most). Continue fish oil vs other omega-3 at 3000-4000mg  daily.  Try and limit the sugary beverages and sweets.  Check back with your insurance at some point regarding Shingrix coverage, as it may change.    It is recommended that you get at least 30 minutes of aerobic exercise at least 5 days/week (for weight loss, you may need as much as 60-90 minutes). This can be any activity that gets your heart rate up. This can be divided in 10-15 minute intervals if needed, but try and build up your endurance at least once a week.  Weight bearing exercise is also recommended twice weekly.   Fat and Cholesterol Restricted Diet Getting too much fat and cholesterol in your diet may cause health problems. Following this diet helps keep your fat and cholesterol at normal levels. This can keep you from getting sick. What types of fat should I choose?  Choose monosaturated and polyunsaturated fats. These are found in foods such as olive oil, canola oil, flaxseeds, walnuts, almonds, and seeds.  Eat more omega-3 fats. Good choices include salmon, mackerel, sardines, tuna, flaxseed oil, and ground flaxseeds.  Limit saturated fats. These are in animal products such as meats, butter, and cream. They can also be in plant products such as palm oil, palm kernel oil, and coconut oil.  Avoid foods with partially hydrogenated oils in them. These contain trans fats. Examples of foods that have trans fats are stick margarine, some tub margarines, cookies, crackers, and other baked goods. What general guidelines do I need to follow?  Check food labels. Look for the words "trans fat" and "saturated fat."  When preparing a meal: ? Fill half of your plate with vegetables and green salads. ? Fill one fourth of your plate with whole  grains. Look for the word "whole" as the first word in the ingredient list. ? Fill one fourth of your plate with lean protein foods.  Eat more foods that have fiber, like apples, carrots, beans, peas, and barley.  Eat more home-cooked foods. Eat less at restaurants and buffets.  Limit or avoid alcohol.  Limit foods high in starch and sugar.  Limit fried foods.  Cook foods without frying them. Baking, boiling, grilling, and broiling are all great options.  Lose weight if you are overweight. Losing even a small amount of weight can help your overall health. It can also help prevent diseases such as diabetes and heart disease. What foods can I eat? Grains Whole grains, such as whole wheat or whole grain breads, crackers, cereals, and pasta. Unsweetened oatmeal, bulgur, barley, quinoa, or brown rice. Corn or whole wheat flour tortillas. Vegetables Fresh or frozen vegetables (raw, steamed, roasted, or grilled). Green salads. Fruits All fresh, canned (in natural juice), or frozen fruits. Meat and Other Protein Products Ground beef (85% or leaner), grass-fed beef, or beef trimmed of fat. Skinless chicken or [place]. Ground chicken or [place]. Pork trimmed of fat. All fish and seafood. Eggs. Dried beans, peas, or lentils. Unsalted nuts or seeds. Unsalted canned or dry beans. Dairy Low-fat dairy products, such as skim or 1% milk, 2% or reduced-fat cheeses, low-fat ricotta or cottage cheese, or plain low-fat yogurt. Fats and Oils Tub margarines without trans fats. Light or reduced-fat mayonnaise and salad dressings. Avocado. Olive, canola, sesame, or safflower oils.  Natural peanut or almond butter (choose ones without added sugar and oil). The items listed above may not be a complete list of recommended foods or beverages. Contact your dietitian for more options. What foods are not recommended? Grains White bread. White pasta. White rice. Cornbread. Bagels, pastries, and croissants. Crackers that  contain trans fat. Vegetables White potatoes. Corn. Creamed or fried vegetables. Vegetables in a cheese sauce. Fruits Dried fruits. Canned fruit in light or heavy syrup. Fruit juice. Meat and Other Protein Products Fatty cuts of meat. Ribs, chicken wings, bacon, sausage, bologna, salami, chitterlings, fatback, hot dogs, bratwurst, and packaged luncheon meats. Liver and organ meats. Dairy Whole or 2% milk, cream, half-and-half, and cream cheese. Whole milk cheeses. Whole-fat or sweetened yogurt. Full-fat cheeses. Nondairy creamers and whipped toppings. Processed cheese, cheese spreads, or cheese curds. Sweets and Desserts Corn syrup, sugars, honey, and molasses. Candy. Jam and jelly. Syrup. Sweetened cereals. Cookies, pies, cakes, donuts, muffins, and ice cream. Fats and Oils Butter, stick margarine, lard, shortening, ghee, or bacon fat. Coconut, palm kernel, or palm oils. Beverages Alcohol. Sweetened drinks (such as sodas, lemonade, and fruit drinks or punches). The items listed above may not be a complete list of foods and beverages to avoid. Contact your dietitian for more information. This information is not intended to replace advice given to you by your health care provider. Make sure you discuss any questions you have with your health care provider. Document Released: 09/12/2011 Document Revised: 11/18/2015 Document Reviewed: 06/12/2013 Elsevier Interactive Patient Education  2018 [employer].   MyPlate from [employer] The general, healthful diet is based on the 2010 Dietary Guidelines for Americans. The amount of food you need to eat from each food group depends on your age, sex, and level of physical activity and can be individualized by a dietitian. Go to https://www.bernard.org/ for more information. What do I need to know about the MyPlate plan?  Enjoy your food, but eat less.  Avoid oversized portions. ?  of your plate should include fruits and vegetables. ?  of your plate should be  grains. ?  of your plate should be protein. Grains  Make at least half of your grains whole grains.  For a 2,000 calorie daily food plan, eat 6 oz every day.  1 oz is about 1 slice bread, 1 cup cereal, or  cup cooked rice, cereal, or pasta. Vegetables  Make half your plate fruits and vegetables.  For a 2,000 calorie daily food plan, eat 2 cups every day.  1 cup is about 1 cup raw or cooked vegetables or vegetable juice or 2 cups raw leafy greens. Fruits  Make half your plate fruits and vegetables.  For a 2,000 calorie daily food plan, eat 2 cups every day.  1 cup is about 1 cup fruit or 100% fruit juice or  cup dried fruit. Protein  For a 2,000 calorie daily food plan, eat 5 oz every day.  1 oz is about 1 oz meat, poultry, or fish,  cup cooked beans, 1 egg, 1 Tbsp peanut butter, or  oz nuts or seeds. Dairy  Switch to fat-free or low-fat (1%) milk.  For a 2,000 calorie daily food plan, eat 3 cups every day.  1 cup is about 1 cup milk or yogurt or soy milk (soy beverage), 1 oz natural cheese, or 2 oz processed cheese. Fats, Oils, and Empty Calories  Only small amounts of oils are recommended.  Empty calories are calories from solid fats or added sugars.  Compare sodium in foods like soup, bread, and frozen meals. Choose the foods with lower numbers.  Drink water instead of sugary drinks. What foods can I eat? Grains Whole grains such as whole wheat, quinoa, millet, and bulgur. Bread, rolls, and pasta made from whole grains. Brown or wild rice. Hot or cold cereals made from whole grains and without added sugar. Vegetables All fresh vegetables, especially fresh red, dark green, or orange vegetables. Peas and beans. Low-sodium frozen or canned vegetables prepared without added salt. Low-sodium vegetable juices. Fruits All fresh, frozen, and dried fruits. Canned fruit packed in water or fruit juice without added sugar. Fruit juices without added sugar. Meats and  Other Protein Sources Boiled, baked, or grilled lean meat trimmed of fat. Skinless poultry. Fresh seafood and shellfish. Canned seafood packed in water. Unsalted nuts and unsalted nut butters. Tofu. Dried beans and pea. Eggs. Dairy Low-fat or fat-free milk, yogurt, and cheeses. Sweets and Desserts Frozen desserts made from low-fat milk. Fats and Oils Olive, peanut, and canola oils and margarine. Salad dressing and mayonnaise made from these oils. Other Soups and casseroles made from allowed ingredients and without added fat or salt. The items listed above may not be a complete list of recommended foods or beverages. Contact your dietitian for more options. What foods are not recommended? Grains Sweetened, low-fiber cereals. Packaged baked goods. Snack crackers and chips. Cheese crackers, butter crackers, and biscuits. Frozen waffles, sweet breads, doughnuts, pastries, packaged baking mixes, pancakes, cakes, and cookies. Vegetables Regular canned or frozen vegetables or vegetables prepared with salt. Canned tomatoes. Canned tomato sauce. Fried vegetables. Vegetables in cream sauce or cheese sauce. Fruits Fruits packed in syrup or made with added sugar. Meats and Other Protein Sources Marbled or fatty meats such as ribs. Poultry with skin. Fried meats, poultry, eggs, or fish. Sausages, hot dogs, and deli meats such as pastrami, bologna, or salami. Dairy Whole milk, cream, cheeses made from whole milk, sour cream. Ice cream or yogurt made from whole milk or with added sugar. Beverages For adults, no more than one alcoholic drink per day. Regular soft drinks or other sugary beverages. Juice drinks. Sweets and Desserts Sugary or fatty desserts, candy, and other sweets. Fats and Oils Solid shortening or partially hydrogenated oils. Solid margarine. Margarine that contains trans fats. Butter. The items listed above may not be a complete list of foods and beverages to avoid. Contact your  dietitian for more information. This information is not intended to replace advice given to you by your health care provider. Make sure you discuss any questions you have with your health care provider. Document Released: 04/02/2007 Document Revised: 08/19/2015 Document Reviewed: 02/19/2013 Elsevier Interactive Patient Education  [employer].

## 2017-07-05 ENCOUNTER — Telehealth: Payer: Self-pay | Admitting: Family Medicine

## 2017-07-05 NOTE — Telephone Encounter (Signed)
Received requested diabetic eye exam note. Sending back for review.

## 2018-01-01 ENCOUNTER — Other Ambulatory Visit: Payer: 59

## 2018-01-01 DIAGNOSIS — I10 Essential (primary) hypertension: Secondary | ICD-10-CM

## 2018-01-01 DIAGNOSIS — E118 Type 2 diabetes mellitus with unspecified complications: Secondary | ICD-10-CM

## 2018-01-01 DIAGNOSIS — E782 Mixed hyperlipidemia: Secondary | ICD-10-CM

## 2018-01-02 LAB — COMPREHENSIVE METABOLIC PANEL
ALBUMIN: 4.1 g/dL (ref 3.5–5.5)
ALT: 31 IU/L (ref 0–32)
AST: 30 IU/L (ref 0–40)
Albumin/Globulin Ratio: 1.6 (ref 1.2–2.2)
Alkaline Phosphatase: 54 IU/L (ref 39–117)
BUN / CREAT RATIO: 13 (ref 9–23)
BUN: 9 mg/dL (ref 6–24)
Bilirubin Total: 0.2 mg/dL (ref 0.0–1.2)
CALCIUM: 9.2 mg/dL (ref 8.7–10.2)
CO2: 24 mmol/L (ref 20–29)
CREATININE: 0.69 mg/dL (ref 0.57–1.00)
Chloride: 103 mmol/L (ref 96–106)
GFR calc Af Amer: 114 mL/min/{1.73_m2} (ref >59)
GFR, EST NON AFRICAN AMERICAN: 99 mL/min/{1.73_m2} (ref >59)
GLOBULIN, TOTAL: 2.6 g/dL (ref 1.5–4.5)
GLUCOSE: 130 mg/dL — AB (ref 65–99)
Potassium: 4.5 mmol/L (ref 3.5–5.2)
SODIUM: 141 mmol/L (ref 134–144)
Total Protein: 6.7 g/dL (ref 6.0–8.5)

## 2018-01-02 LAB — HEMOGLOBIN A1C
ESTIMATED AVERAGE GLUCOSE: 151 mg/dL
Hgb A1c MFr Bld: 6.9 % — ABNORMAL HIGH (ref 4.8–5.6)

## 2018-01-02 LAB — LIPID PANEL
CHOL/HDL RATIO: 3.1 ratio (ref 0.0–4.4)
Cholesterol, Total: 132 mg/dL (ref 100–199)
HDL: 43 mg/dL (ref >39)
LDL CALC: 51 mg/dL (ref 0–99)
Triglycerides: 191 mg/dL — ABNORMAL HIGH (ref 0–149)
VLDL CHOLESTEROL CAL: 38 mg/dL (ref 5–40)

## 2018-01-03 ENCOUNTER — Encounter: Payer: Self-pay | Admitting: Family Medicine

## 2018-01-08 NOTE — Patient Instructions (Addendum)
You are past due for colon cancer screening.  Screening options include colonoscopy (my preference), but if unable to get this done, check with your insurance to see if they cover Cologuard testing. If covered, we can refer for this less invasive test (requires you to mail in a stool sample). If this test is normal, it can be repeated every 3 years.  If it is abnormal ("positive"), then you need to have a colonoscopy, as likely there is something wrong (may still be benign, like an early polyp, but could be more serious).  Check back with your insurance to see if Shingrix is covered.  If so, please schedule for nurse visit to start the 2-dose series, given 2 months apart.  Your next diabetic eye exam is due in November.  Cut back on the orange juice each evening (try 2-4 oz and add more water). Try unsweetened tea and use Stevia or other artificial sweetener (until you're ready to just try water). Periodically check your sugars in the evening (bedtime).  Take your grandson out for walks after work as a way of getting exercise that is enjoyable for both of you. Try and get 150 minutes/week, even if just in 10-15 minute intervals of getting your heart rate up.

## 2018-01-08 NOTE — Progress Notes (Signed)
Chief Complaint  Patient presents with  . Diabetes    nonfasting med check.    Hyperlipidemia: Patient has been on Crestor 20mg  since 09/2016 (changed from lovastatin and fenofibrate). She is compliant withmedication and denies side effects.  (She previously had slight aches from lovastatin).Shetries to follow a low cholesterol diet. Shecontinues to take2 BID of fish oil.She had labs prior to her visit today--see below. She still drinks 1 glass of sweet tea with dinner.  sporadic soda (regular). No longer having sweets in the evening, just occasional during the day (craving for sweets has diminished).  Still eats fried foods, 3-4x/week.  Hypertension follow-up: She hasn't been checking her blood pressureat home.Denies dizziness, headaches, chest pain. Denies side effects of medications. She is compliant in taking lisinopril 20mg  daily.  Diabetes follow-up: She is compliant with taking metformin 500mg  BID and denies side effects. Blood sugars at home are running 130's, fasting. Doesn't check it other times of day. Denies hypoglycemia. Tries to follow a lowfat, low sugar diet--see above. Denies polydipsia and polyuria, numbness, tingling, neuropathy. Had her diabetic eye exam 02/05/2017 in [place]. She checks feet regularly without concerns. She gets no regular exercise. Watches her grandson after work a few days/week Drinks 3/4 cup of orange juice each evening with her fiber supplement.  Capsulitis in her R shoulder, had cortisone injection the end of August and it is much better.  Health Maintenance items: She had annual exam with Dr. Henderson Cloud yesterday, and had mammogram and pap smear  Colon cancer screening:  Previously referred to GI (Mann/Hung) by her GYN, +FH colon cancer, hasn't had yet.  Immunization History  Administered Date(s) Administered  . Influenza,inj,Quad PF,6+ Mos 01/03/2017  . Pneumococcal Polysaccharide-23 12/27/2015  . Tdap 12/27/2015   PMH, PSH,  SH reviewed  Outpatient Encounter Medications as of 01/09/2018  Medication Sig  . estradiol (ESTRACE) 0.5 MG tablet Take 0.5 mg by mouth daily.  Marland Kitchen lisinopril (PRINIVIL,ZESTRIL) 20 MG tablet Take 1 tablet (20 mg total) by mouth daily.  . metFORMIN (GLUCOPHAGE) 500 MG tablet Take 1 tablet (500 mg total) by mouth 2 (two) times daily with a meal.  . Multiple Vitamins-Minerals (MULTIVITAMIN WITH MINERALS) tablet Take 1 tablet by mouth daily.  . Omega-3 Fatty Acids (FISH OIL) 1000 MG CPDR Take 2 capsules by mouth 2 (two) times daily.  . rosuvastatin (CRESTOR) 20 MG tablet Take 1 tablet (20 mg total) by mouth daily.  Marland Kitchen VITAMIN D, CHOLECALCIFEROL, PO Take 5,000 Int'l Units by mouth daily.   No facility-administered encounter medications on file as of 01/09/2018.    Allergies  Allergen Reactions  . Codeine Rash    ROS: no fever, chills, headaches, dizziness, numbness, tingling, URI symptoms, cough, shortness of breath, chest pain,palpitations,GI or GU complaints. Moods are good. No bleeding, bruising, rash or other complaints.  R shoulder pain resolved, s/p cortisone shot.  See HPI  PHYSICAL EXAM:  BP 124/80   Pulse 68   Ht 5\' 7"  (1.702 m)   Wt 219 lb 9.6 oz (99.6 kg)   BMI 34.39 kg/m   Wt Readings from Last 3 Encounters:  01/09/18 219 lb 9.6 oz (99.6 kg)  07/04/17 217 lb 6.4 oz (98.6 kg)  01/03/17 213 lb 6.4 oz (96.8 kg)   Well appearing, pleasant, obese female in no distress, in good spirits HEENT: PERRL, EOMI, conjunctiva and sclera are clear. OP clear, nose is clear, sinuses nontender Neck: no lymphadenopathy thyromegaly or carotid bruit Heart: regular rate and rhythm Lungs: clear bilaterally  Back: no CVA or spinal tenderness Abdomen: soft, nontender, no mass Extremities: no edema, 2+ pulses, normal sensation.  Skin: normal turgor, no rashes Neuro: cranial nerves intact, normal strength, sensation, gait Psych: normal mood, affect, hygiene and grooming  Lab Results   Component Value Date   HGBA1C 6.9 (H) 01/01/2018   (A1c is up from April, when it was 6.6%)    Chemistry      Component Value Date/Time   NA 141 01/01/2018 0827   K 4.5 01/01/2018 0827   CL 103 01/01/2018 0827   CO2 24 01/01/2018 0827   BUN 9 01/01/2018 0827   CREATININE 0.69 01/01/2018 0827   CREATININE 0.75 12/29/2016 0754      Component Value Date/Time   CALCIUM 9.2 01/01/2018 0827   ALKPHOS 54 01/01/2018 0827   AST 30 01/01/2018 0827   ALT 31 01/01/2018 0827   BILITOT 0.2 01/01/2018 0827     Fasting glu 130  Lab Results  Component Value Date   CHOL 132 01/01/2018   HDL 43 01/01/2018   LDLCALC 51 01/01/2018   TRIG 191 (H) 01/01/2018   CHOLHDL 3.1 01/01/2018    ASSESSMENT/PLAN:  Mixed hyperlipidemia - LDL at goal; TG remains elevated, can improve diet some. Cont Crestor. Cont fish oil. Cut back fried foods, sugar; exercise, wt loss - Plan: rosuvastatin (CRESTOR) 20 MG tablet  Hyperlipidemia associated with type 2 diabetes mellitus (HCC)  Controlled type 2 diabetes mellitus with complication, without long-term current use of insulin (HCC) - Plan: Microalbumin/Creatinine Ratio, Urine  Colon cancer screening - discussed colonoscopy and Cologuard--prefers Cologuard, will check her insurance coverage. Understands need for colonoscopy if abnormal  Need for influenza vaccination - Plan: Flu Vaccine QUAD 6+ mos PF IM (Fluarix Quad PF)  Essential hypertension - controlled, continue lisinopril - Plan: lisinopril (PRINIVIL,ZESTRIL) 20 MG tablet  Diabetes mellitus without complication (HCC) - controlled, though a1c higher. Disc proper diet, daily exercise, weight loss. Declines increasing metformin dose today - Plan: metFORMIN (GLUCOPHAGE) 500 MG tablet  rec rechecking with insurance re: Shingrix, Cologuard  GET MAMMO/PAP from Dr. Henderson Cloud (yest)  F/u 6 mos with labs prior--A1c, lipid, glu, LFT, TSH    She declines increase in metformin today. Offered change to ER  1500mg  dose total. Prefers to work on diet/exercise to keep A1c down.  Diabetic eye exam due November  Cut back on the orange juice each evening (try 2-4 oz and add more water). Try unsweetened tea and use Stevia or other artificial sweetener (until you're ready to just try water). Periodically check your sugars in the evening (bedtime).  Take your grandson out for walks after work as a way of getting exercise that is enjoyable for both of you. Try and get 150 minutes/week, even if just in 10-15 minute intervals of getting your heart rate up.

## 2018-01-09 ENCOUNTER — Encounter: Payer: Self-pay | Admitting: Family Medicine

## 2018-01-09 ENCOUNTER — Ambulatory Visit: Payer: 59 | Admitting: Family Medicine

## 2018-01-09 VITALS — BP 124/80 | HR 68 | Ht 67.0 in | Wt 219.6 lb

## 2018-01-09 DIAGNOSIS — E782 Mixed hyperlipidemia: Secondary | ICD-10-CM | POA: Diagnosis not present

## 2018-01-09 DIAGNOSIS — E785 Hyperlipidemia, unspecified: Secondary | ICD-10-CM

## 2018-01-09 DIAGNOSIS — Z1211 Encounter for screening for malignant neoplasm of colon: Secondary | ICD-10-CM

## 2018-01-09 DIAGNOSIS — Z23 Encounter for immunization: Secondary | ICD-10-CM | POA: Diagnosis not present

## 2018-01-09 DIAGNOSIS — E1169 Type 2 diabetes mellitus with other specified complication: Secondary | ICD-10-CM

## 2018-01-09 DIAGNOSIS — E119 Type 2 diabetes mellitus without complications: Secondary | ICD-10-CM

## 2018-01-09 DIAGNOSIS — E118 Type 2 diabetes mellitus with unspecified complications: Secondary | ICD-10-CM | POA: Diagnosis not present

## 2018-01-09 DIAGNOSIS — I10 Essential (primary) hypertension: Secondary | ICD-10-CM

## 2018-01-09 LAB — HM MAMMOGRAPHY

## 2018-01-09 MED ORDER — ROSUVASTATIN CALCIUM 20 MG PO TABS: 20.0000 mg | ORAL_TABLET | Freq: Every day | ORAL | 1 refills | Status: DC

## 2018-01-09 MED ORDER — LISINOPRIL 20 MG PO TABS: 20.0000 mg | ORAL_TABLET | Freq: Every day | ORAL | 1 refills | Status: DC

## 2018-01-09 MED ORDER — METFORMIN HCL 500 MG PO TABS: 500.0000 mg | ORAL_TABLET | Freq: Two times a day (BID) | ORAL | 1 refills | Status: DC

## 2018-01-10 ENCOUNTER — Telehealth: Payer: Self-pay | Admitting: Family Medicine

## 2018-01-10 LAB — MICROALBUMIN / CREATININE URINE RATIO
Creatinine, Urine: 17.3 mg/dL
Microalbumin, Urine: <3 ug/mL

## 2018-01-10 NOTE — Telephone Encounter (Signed)
Received requested records from [facility]. Sending back for review.

## 2018-01-14 ENCOUNTER — Encounter: Payer: Self-pay | Admitting: Family Medicine

## 2018-01-14 ENCOUNTER — Other Ambulatory Visit: Payer: Self-pay | Admitting: *Deleted

## 2018-01-14 DIAGNOSIS — Z129 Encounter for screening for malignant neoplasm, site unspecified: Secondary | ICD-10-CM

## 2018-01-16 ENCOUNTER — Other Ambulatory Visit (INDEPENDENT_AMBULATORY_CARE_PROVIDER_SITE_OTHER): Payer: 59

## 2018-01-16 DIAGNOSIS — Z23 Encounter for immunization: Secondary | ICD-10-CM

## 2018-02-02 LAB — COLOGUARD: COLOGUARD: NEGATIVE

## 2018-04-02 ENCOUNTER — Other Ambulatory Visit (INDEPENDENT_AMBULATORY_CARE_PROVIDER_SITE_OTHER): Payer: 59

## 2018-04-02 DIAGNOSIS — Z23 Encounter for immunization: Secondary | ICD-10-CM

## 2018-04-18 LAB — HM DIABETES EYE EXAM

## 2018-06-25 ENCOUNTER — Encounter: Payer: Self-pay | Admitting: Family Medicine

## 2018-06-25 DIAGNOSIS — E782 Mixed hyperlipidemia: Secondary | ICD-10-CM

## 2018-06-25 MED ORDER — ROSUVASTATIN CALCIUM 20 MG PO TABS: 20.0000 mg | ORAL_TABLET | Freq: Every day | ORAL | 0 refills | Status: DC

## 2018-07-04 ENCOUNTER — Other Ambulatory Visit: Payer: 59

## 2018-07-11 ENCOUNTER — Encounter: Payer: 59 | Admitting: Family Medicine

## 2018-07-19 ENCOUNTER — Telehealth: Payer: Self-pay | Admitting: Internal Medicine

## 2018-07-19 NOTE — Telephone Encounter (Signed)
Angeliqic from [employer] called to let you know that you will need to resubmit a new Diagnosis code for pt's cologuard screening as the dx  Code Z12.9 is considered experiential and not appropiate as it needs to be considered non experiential. She could not give the correct dx code that it needs to be as she said then that would be fraud if she gave me the correct code that needs to be used for this and told me that you would have to find the right dx code to be sent back through [facility] through billing to be resubmitted. Pt is being sent a bill for the cost for cologuard since DX code was not covered.   [facility] number is [phone]  The number she called from was [phone].

## 2018-07-19 NOTE — Telephone Encounter (Signed)
The correct code should be Z12.11, screening for colon cancer. Please contact and correct (and be sure to use this, not Z12.9 in the future, which is simply "screening for cancer")

## 2018-07-22 NOTE — Telephone Encounter (Signed)
Taken care of

## 2018-07-30 ENCOUNTER — Encounter: Payer: Self-pay | Admitting: Family Medicine

## 2018-07-31 ENCOUNTER — Other Ambulatory Visit: Payer: Self-pay

## 2018-07-31 DIAGNOSIS — E119 Type 2 diabetes mellitus without complications: Secondary | ICD-10-CM

## 2018-07-31 MED ORDER — METFORMIN HCL 500 MG PO TABS: 500.0000 mg | ORAL_TABLET | Freq: Two times a day (BID) | ORAL | 1 refills | Status: DC

## 2018-08-13 ENCOUNTER — Other Ambulatory Visit: Payer: Self-pay

## 2018-08-13 ENCOUNTER — Encounter: Payer: Self-pay | Admitting: Family Medicine

## 2018-08-13 DIAGNOSIS — I10 Essential (primary) hypertension: Secondary | ICD-10-CM

## 2018-08-13 MED ORDER — LISINOPRIL 20 MG PO TABS: 20.0000 mg | ORAL_TABLET | Freq: Every day | ORAL | 1 refills | Status: DC

## 2018-09-04 ENCOUNTER — Other Ambulatory Visit: Payer: 59

## 2018-09-04 ENCOUNTER — Other Ambulatory Visit: Payer: Self-pay

## 2018-09-04 DIAGNOSIS — E782 Mixed hyperlipidemia: Secondary | ICD-10-CM

## 2018-09-04 DIAGNOSIS — E118 Type 2 diabetes mellitus with unspecified complications: Secondary | ICD-10-CM

## 2018-09-04 LAB — LIPID PANEL

## 2018-09-05 LAB — LIPID PANEL
Chol/HDL Ratio: 3.2 ratio (ref 0.0–4.4)
Cholesterol, Total: 142 mg/dL (ref 100–199)
HDL: 45 mg/dL (ref >39)
LDL Calculated: 50 mg/dL (ref 0–99)
Triglycerides: 237 mg/dL — ABNORMAL HIGH (ref 0–149)
VLDL Cholesterol Cal: 47 mg/dL — ABNORMAL HIGH (ref 5–40)

## 2018-09-05 LAB — HEPATIC FUNCTION PANEL
ALT: 23 IU/L (ref 0–32)
AST: 23 IU/L (ref 0–40)
Albumin: 4.5 g/dL (ref 3.8–4.9)
Alkaline Phosphatase: 57 IU/L (ref 39–117)
Bilirubin Total: 0.4 mg/dL (ref 0.0–1.2)
Bilirubin, Direct: 0.12 mg/dL (ref 0.00–0.40)
Total Protein: 7.1 g/dL (ref 6.0–8.5)

## 2018-09-05 LAB — HEMOGLOBIN A1C
Est. average glucose Bld gHb Est-mCnc: 154 mg/dL
Hgb A1c MFr Bld: 7 % — ABNORMAL HIGH (ref 4.8–5.6)

## 2018-09-05 LAB — GLUCOSE, RANDOM: Glucose: 144 mg/dL — ABNORMAL HIGH (ref 65–99)

## 2018-09-05 LAB — TSH: TSH: 3.37 u[IU]/mL (ref 0.450–4.500)

## 2018-09-10 DIAGNOSIS — E785 Hyperlipidemia, unspecified: Secondary | ICD-10-CM | POA: Insufficient documentation

## 2018-09-10 DIAGNOSIS — E1169 Type 2 diabetes mellitus with other specified complication: Secondary | ICD-10-CM | POA: Insufficient documentation

## 2018-09-10 NOTE — Progress Notes (Signed)
Start time: 4:19 End time: 4:58   Virtual Visit via Video Note  I connected with Judith Nolan on 09/11/2018 by a video enabled telemedicine application and verified that I am speaking with the correct person using two identifiers.  Location: Patient: at work (alone, 2 dogs) Provider: office   I discussed the limitations of evaluation and management by telemedicine and the availability of in person appointments. The patient expressed understanding and agreed to proceed. She consents to filing her insurance for this visit.  History of Present Illness:  Chief Complaint  Patient presents with  . Hyperlipidemia    VIRTUAL med check. No new concerns.     Patient presents for med check (due in April, postponed and being done virtually due to COVID-19 pandemic). She had labs done prior to her visit, see below.  She is bored at home, nothing to do.  Denies any major changes in diet/activity related to the pandemic, still working in office.  Hyperlipidemia: Patient continues on Crestor 79m, reports compliance withmedication and denies side effects.Shetries to follow a low cholesterol diet. Shecontinues to take2 BID of fish oil.  At her October visit she reported still having 1 glass of sweet tea with dinner.  She reports today that she only takes a couple of swallows and pours out the rest (1/2 class or less) of sweet tea each night, not drinking anything else.  She cut back on sweets, not eating as much, no longer craving them daily.  She used to go out to eat, enjoyed salad bars.  Some salads since being home, but "not the same".  Eating more fried foods at home, but using air fryer. Recently started eating [facility] again, [facility]  Hypertension follow-up: She hasn't been checking her blood pressureat home.Denies dizziness, headaches, chest pain. Denies side effects of medications. She is compliant in taking lisinopril 271mdaily.  Diabetes follow-up:  She tries to follow alowfat, low sugar diet--see above.  She is compliant with taking [id number] and denies side effects. Blood sugars at home are running130's-150's, depending on what and how late she eats the night before. Checks every day, only in the mornings. Denies hypoglycemia, polydipsia and polyuria, numbness, tingling, neuropathy. Had her diabetic eye exam 03/2018, no retinopathy. Shechecks feet regularly without concerns.  Not getting any regular exercise.  Health Maintenance: Immunization History  Administered Date(s) Administered  . Influenza,inj,Quad PF,6+ Mos 01/03/2017, 01/09/2018  . Pneumococcal Polysaccharide-23 12/27/2015  . Tdap 12/27/2015  . Zoster Recombinat (Shingrix) 01/16/2018, 04/02/2018   She had annual exam with Dr. HorPhilis Pique October 2019, and had mammogram and pap smear Colon cancer screening: had negative Cologuard test 01/2018   PMH, PSH, SH reviewed  Outpatient Encounter Medications as of 09/11/2018  Medication Sig  . estradiol (ESTRACE) 0.5 MG tablet Take 0.5 mg by mouth daily.  . lMarland Kitchensinopril (ZESTRIL) 20 MG tablet Take 1 tablet (20 mg total) by mouth daily.  . metFORMIN (GLUCOPHAGE) 500 MG tablet Take 1 tablet (500 mg total) by mouth 2 (two) times daily with a meal.  . Multiple Vitamins-Minerals (MULTIVITAMIN WITH MINERALS) tablet Take 1 tablet by mouth daily.  . Omega-3 Fatty Acids (FISH OIL) 1000 MG CPDR Take 2 capsules by mouth 2 (two) times daily.  . rosuvastatin (CRESTOR) 20 MG tablet Take 1 tablet (20 mg total) by mouth daily.  . VMarland KitchenTAMIN D, CHOLECALCIFEROL, PO Take 5,000 Int'l Units by mouth daily.   No facility-administered encounter medications on file as of 09/11/2018.  Allergies  Allergen Reactions  . Codeine Rash   ROS:  No fever, chills, URI symptoms, cough, shortness of breath, chest pain, palpitations. No GI or GU complaints. No bleeding, bruising, rash.  See HPI Denies joint pain (no recurrent problems with her  shoulder, previously had injection for frozen shoulder).    Observations/Objective:  BP 110/70   Pulse 78   Temp 98.4 F (36.9 C) (Temporal)   Ht _0  (1.702 m)   Wt 212 lb (96.2 kg)   BMI 33.20 kg/m    Wt Readings from Last 3 Encounters:  09/04/18 212 lb 12.8 oz (96.5 kg)  01/09/18 219 lb 9.6 oz (99.6 kg)  07/04/17 217 lb 6.4 oz (98.6 kg)   Well-appearing, pleasant female, in no distress. She is alert, oriented, in good spirits. Cranial nerves are grossly intact. Normal speech, eye contact, mood and affect. Limited exam due to virtual nature of the visit  Lab Results  Component Value Date   HGBA1C 7.0 (H) 09/04/2018   Fasting glucose 144  Lab Results  Component Value Date   ALT 23 09/04/2018   AST 23 09/04/2018   ALKPHOS 57 09/04/2018   BILITOT 0.4 09/04/2018    Lab Results  Component Value Date   CHOL 142 09/04/2018   HDL 45 09/04/2018   LDLCALC 50 09/04/2018   TRIG 237 (H) 09/04/2018   CHOLHDL 3.2 09/04/2018   Lab Results  Component Value Date   TSH 3.370 09/04/2018     Assessment and Plan:  Type 2 diabetes mellitus with other specified complication, without long-term current use of insulin (HCC) - A1c up to 7.0. No exercise, diet not optimal. If not candidate for [employer] study, to increase metformin to 3/d   Hyperlipidemia associated with type 2 diabetes mellitus ([place])   Essential hypertension - well controlled   Mixed hyperlipidemia - LDL at goal. TG remains above goal. Reviewed diet in detail. If remains elevated at next check, consider change from OTC fish oil to vascepa or lovaza - Plan: rosuvastatin (CRESTOR) 20 MG tablet  Class 1 obesity due to excess calories with serious comorbidity and body mass index (BMI) of 32.0 to 32.9 in adult - counseled re: diet, exercise, risks of obesity   Referred to [employer] for DM study (to increase metformin to 3/day if not eligible)  F/u 6 months with fasting labs prior Lipids, A1c,  c-met    Follow Up Instructions:    I discussed the assessment and treatment plan with the patient. The patient was provided an opportunity to ask questions and all were answered. The patient agreed with the plan and demonstrated an understanding of the instructions.   The patient was advised to call back or seek an in-person evaluation if the symptoms worsen or if the condition fails to improve as anticipated.  I provided 39 minutes of non-face-to-face time during this encounter.   Vikki Ports, MD

## 2018-09-11 ENCOUNTER — Ambulatory Visit (INDEPENDENT_AMBULATORY_CARE_PROVIDER_SITE_OTHER): Payer: 59 | Admitting: Family Medicine

## 2018-09-11 ENCOUNTER — Other Ambulatory Visit: Payer: Self-pay

## 2018-09-11 ENCOUNTER — Encounter: Payer: Self-pay | Admitting: Family Medicine

## 2018-09-11 VITALS — BP 110/70 | HR 78 | Temp 98.4°F | Ht 67.0 in | Wt 212.0 lb

## 2018-09-11 DIAGNOSIS — Z6832 Body mass index (BMI) 32.0-32.9, adult: Secondary | ICD-10-CM

## 2018-09-11 DIAGNOSIS — Z5181 Encounter for therapeutic drug level monitoring: Secondary | ICD-10-CM

## 2018-09-11 DIAGNOSIS — E66811 Obesity, class 1: Secondary | ICD-10-CM

## 2018-09-11 DIAGNOSIS — E6609 Other obesity due to excess calories: Secondary | ICD-10-CM | POA: Diagnosis not present

## 2018-09-11 DIAGNOSIS — E1169 Type 2 diabetes mellitus with other specified complication: Secondary | ICD-10-CM

## 2018-09-11 DIAGNOSIS — E782 Mixed hyperlipidemia: Secondary | ICD-10-CM

## 2018-09-11 DIAGNOSIS — I10 Essential (primary) hypertension: Secondary | ICD-10-CM | POA: Diagnosis not present

## 2018-09-11 DIAGNOSIS — E785 Hyperlipidemia, unspecified: Secondary | ICD-10-CM

## 2018-09-11 MED ORDER — ROSUVASTATIN CALCIUM 20 MG PO TABS: 20.0000 mg | ORAL_TABLET | Freq: Every day | ORAL | 1 refills | Status: DC

## 2018-09-11 NOTE — Patient Instructions (Addendum)
Consider checking evening sugars (2 hours after a meal or bedtime) to try and see which meals/foods are increasing your sugars so that you can make changes. Cut back on sweet tea but try and drink more fluids (non-caloric, non-caffeinated) as you need 6-8 glasses or more daily of water/fluids.  We are going to refer you to [employer] to see if you are eligible for one of their diabetic study. If you do not qualify, THEN I want to increase the metformin dose to 3/day--can take 2 with breakfast and 1 with dinner. (can switch around if needed based on sugars or tolerability). If you have side effects or issues, please contact me.  Feel free to send a list of your blood sugars (fax, MyChart message) after a month of being on the higher dose. (If you are on the study, don't worry about this).  Try and cut back on fried foods, limiting the "bad" stuff that can go on salads (croutons, cheese, bacon bits, salads made with a lot of mayonnaise such as potato salad, etc).  Please try and start a regular exercise routine.  The goal is 150 minutes a week of aerobic activity--it can be separated in to 10-15 minute intervals.  Walking, dancing, whatever works for you!  If your cholesterol (triglycerides) remains over 200 on your next check, we may change your over-the-counter fish oil to a prescription such as Vascepa or Lovaza.  If sugars and diet improve, the triglycerides should also improve.  Let me know what else I can do to help you achieve healthy goals (improved diet, exercise, weight loss).

## 2018-09-12 NOTE — Progress Notes (Signed)
Done

## 2018-10-25 ENCOUNTER — Encounter: Payer: Self-pay | Admitting: Family Medicine

## 2018-10-25 DIAGNOSIS — E119 Type 2 diabetes mellitus without complications: Secondary | ICD-10-CM

## 2018-10-25 MED ORDER — METFORMIN HCL 500 MG PO TABS: ORAL_TABLET | ORAL | 1 refills | Status: DC

## 2018-12-12 ENCOUNTER — Encounter: Payer: Self-pay | Admitting: Family Medicine

## 2018-12-24 ENCOUNTER — Other Ambulatory Visit (INDEPENDENT_AMBULATORY_CARE_PROVIDER_SITE_OTHER): Payer: 59

## 2018-12-24 ENCOUNTER — Other Ambulatory Visit: Payer: Self-pay

## 2018-12-24 DIAGNOSIS — Z23 Encounter for immunization: Secondary | ICD-10-CM | POA: Diagnosis not present

## 2019-02-03 ENCOUNTER — Encounter: Payer: Self-pay | Admitting: Family Medicine

## 2019-02-03 DIAGNOSIS — E119 Type 2 diabetes mellitus without complications: Secondary | ICD-10-CM

## 2019-02-03 NOTE — Telephone Encounter (Signed)
I added urine microalbumin to her labs, as that is also due now (other labs are already in system)  I prefer for her to have in office visit because she hasn't actually HAD an in-office visit since 12/2017.  She needs to have diabetic foot exam done, as well as exam (listen to heart, carotids, etc).  So, I'd really prefer in-person this time if she is at all willing to do so.  If not willing, can do virtually, and know it isn't "best care", and that I'll get ding-ed on audits.Marland KitchenMarland Kitchen

## 2019-02-10 ENCOUNTER — Other Ambulatory Visit: Payer: Self-pay | Admitting: Obstetrics and Gynecology

## 2019-02-10 DIAGNOSIS — R928 Other abnormal and inconclusive findings on diagnostic imaging of breast: Secondary | ICD-10-CM

## 2019-02-14 ENCOUNTER — Ambulatory Visit: Payer: Self-pay

## 2019-02-14 ENCOUNTER — Other Ambulatory Visit: Payer: Self-pay

## 2019-02-14 ENCOUNTER — Ambulatory Visit
Admission: RE | Admit: 2019-02-14 | Discharge: 2019-02-14 | Disposition: A | Discharge status: Home / Self Care | Payer: 59 | Source: Ambulatory Visit | Attending: Obstetrics and Gynecology | Admitting: Obstetrics and Gynecology

## 2019-02-14 DIAGNOSIS — R928 Other abnormal and inconclusive findings on diagnostic imaging of breast: Secondary | ICD-10-CM

## 2019-02-17 ENCOUNTER — Other Ambulatory Visit: Payer: Self-pay | Admitting: *Deleted

## 2019-02-17 ENCOUNTER — Encounter: Payer: Self-pay | Admitting: Family Medicine

## 2019-02-17 DIAGNOSIS — I10 Essential (primary) hypertension: Secondary | ICD-10-CM

## 2019-02-17 MED ORDER — LISINOPRIL 20 MG PO TABS: 20.0000 mg | ORAL_TABLET | Freq: Every day | ORAL | 0 refills | Status: DC

## 2019-02-23 ENCOUNTER — Other Ambulatory Visit: Payer: Self-pay | Admitting: Family Medicine

## 2019-02-23 DIAGNOSIS — I10 Essential (primary) hypertension: Secondary | ICD-10-CM

## 2019-02-26 ENCOUNTER — Encounter: Payer: Self-pay | Admitting: *Deleted

## 2019-03-07 ENCOUNTER — Other Ambulatory Visit: Payer: Self-pay

## 2019-03-07 ENCOUNTER — Other Ambulatory Visit: Payer: 59

## 2019-03-07 DIAGNOSIS — Z5181 Encounter for therapeutic drug level monitoring: Secondary | ICD-10-CM

## 2019-03-07 DIAGNOSIS — I10 Essential (primary) hypertension: Secondary | ICD-10-CM

## 2019-03-07 DIAGNOSIS — E1169 Type 2 diabetes mellitus with other specified complication: Secondary | ICD-10-CM

## 2019-03-07 DIAGNOSIS — E782 Mixed hyperlipidemia: Secondary | ICD-10-CM

## 2019-03-07 DIAGNOSIS — E119 Type 2 diabetes mellitus without complications: Secondary | ICD-10-CM

## 2019-03-08 LAB — COMPREHENSIVE METABOLIC PANEL
ALT: 29 IU/L (ref 0–32)
AST: 30 IU/L (ref 0–40)
Albumin/Globulin Ratio: 2 (ref 1.2–2.2)
Albumin: 4.7 g/dL (ref 3.8–4.9)
Alkaline Phosphatase: 65 IU/L (ref 39–117)
BUN/Creatinine Ratio: 21 (ref 9–23)
BUN: 15 mg/dL (ref 6–24)
Bilirubin Total: 0.5 mg/dL (ref 0.0–1.2)
CO2: 25 mmol/L (ref 20–29)
Calcium: 9.1 mg/dL (ref 8.7–10.2)
Chloride: 101 mmol/L (ref 96–106)
Creatinine, Ser: 0.72 mg/dL (ref 0.57–1.00)
GFR calc Af Amer: 109 mL/min/{1.73_m2} (ref >59)
GFR calc non Af Amer: 95 mL/min/{1.73_m2} (ref >59)
Globulin, Total: 2.4 g/dL (ref 1.5–4.5)
Glucose: 134 mg/dL — ABNORMAL HIGH (ref 65–99)
Potassium: 4.5 mmol/L (ref 3.5–5.2)
Sodium: 137 mmol/L (ref 134–144)
Total Protein: 7.1 g/dL (ref 6.0–8.5)

## 2019-03-08 LAB — HEMOGLOBIN A1C
Est. average glucose Bld gHb Est-mCnc: 148 mg/dL
Hgb A1c MFr Bld: 6.8 % — ABNORMAL HIGH (ref 4.8–5.6)

## 2019-03-08 LAB — LIPID PANEL
Chol/HDL Ratio: 3.3 ratio (ref 0.0–4.4)
Cholesterol, Total: 138 mg/dL (ref 100–199)
HDL: 42 mg/dL (ref >39)
LDL Chol Calc (NIH): 57 mg/dL (ref 0–99)
Triglycerides: 243 mg/dL — ABNORMAL HIGH (ref 0–149)
VLDL Cholesterol Cal: 39 mg/dL (ref 5–40)

## 2019-03-08 LAB — MICROALBUMIN / CREATININE URINE RATIO
Creatinine, Urine: 77.3 mg/dL
Microalb/Creat Ratio: 8 mg/g creat (ref 0–29)
Microalbumin, Urine: 6.3 ug/mL

## 2019-03-10 ENCOUNTER — Other Ambulatory Visit: Payer: 59

## 2019-03-11 ENCOUNTER — Other Ambulatory Visit: Payer: Self-pay | Admitting: Family Medicine

## 2019-03-11 DIAGNOSIS — E782 Mixed hyperlipidemia: Secondary | ICD-10-CM

## 2019-03-12 NOTE — Progress Notes (Signed)
Start time: 11:02 End time:  11:27  Virtual Visit via Video Note  I connected with Judith Nolan on 03/13/2019 by a video enabled telemedicine application and verified that I am speaking with the correct person using two identifiers.  Location: Patient: home Provider: office   I discussed the limitations of evaluation and management by telemedicine and the availability of in person appointments. The patient expressed understanding and agreed to proceed.  History of Present Illness:  Chief Complaint  Patient presents with  . Diabetes    VIRTUAL 6 month med check. Will schedule diabetic eye exam for January. No new concerns.     Patient presents for 6 month med check.  She had labs done prior to her visit, see below.  Hyperlipidemia: Patient continues on Crestor 79m, reports compliance withmedication and denies side effects.Shetries to follow a low cholesterol diet. Shecontinues to take2 BID of fish oil. Drinks decaf coffee, uses Sweet & Low, not sugar.  Only a little sweet tea with dinner (a few swallows, 1/2 glass or less). Cut back on sweets. No sweets in the house (now living with father-in-law, who is diabetic) Air-fryer at home.  Hypertension follow-up: She hasn't been checking her blood pressureat home recently.Denies dizziness, headaches, chest pain. Denies side effects of medications. She is compliant in taking lisinopril 258mdaily.  Diabetes follow-up: She tries to follow alowfat, low sugar diet--see above. She is compliant with taking metformin1000/500 (increased after her last visit) and denies side effects. Blood sugars haven't been checked much since she moved in October. Denies hypoglycemia, polydipsia and polyuria, numbness, tingling, neuropathy. Had her diabetic eye exam 03/2018, no retinopathy. Shechecks feet regularly without concerns. Not getting any regular exercise. Has lost some weight--thinks related to stress, no longer eating  out.  Moved in with her father-in-law 01/19/2019. Stage 2 dementia, needs her or husband with him all day long. Working from home, goes to office on Fridays when husband is home. Nephew is on drugs.  Lives in mobile home next door. While on the video call today, police came to door, they having been calling police and ambulance since Sunday, which has been stressful--suspect her BP's are likely higher than normal due to recent stress.   PMH, PSH, SH reviewed  Outpatient Encounter Medications as of 03/13/2019  Medication Sig  . estradiol (ESTRACE) 0.5 MG tablet Take 0.5 mg by mouth daily.  . Marland Kitchenisinopril (ZESTRIL) 20 MG tablet TAKE 1 TABLET BY MOUTH EVERY DAY  . metFORMIN (GLUCOPHAGE) 500 MG tablet Take 2 tablets by mouth with breakfast and 1 tablet with dinner  . Multiple Vitamins-Minerals (MULTIVITAMIN WITH MINERALS) tablet Take 1 tablet by mouth daily.  . Omega-3 Fatty Acids (FISH OIL) 1000 MG CPDR Take 2 capsules by mouth 2 (two) times daily.  . rosuvastatin (CRESTOR) 20 MG tablet TAKE 1 TABLET BY MOUTH EVERY DAY  . VITAMIN D, CHOLECALCIFEROL, PO Take 5,000 Int'l Units by mouth daily.  . Marland Kitchencosapent Ethyl (VASCEPA) 1 g capsule Take 2 capsules (2 g total) by mouth 2 (two) times daily.  . [DISCONTINUED] rosuvastatin (CRESTOR) 20 MG tablet Take 1 tablet (20 mg total) by mouth daily.   No facility-administered encounter medications on file as of 03/13/2019.   (NOT TAKING VASCEPA PRIOR TO VISIT--PRIOR TO VISIT WAS TAKING OMEGA-3 FISH OIL 2 BID).  Allergies  Allergen Reactions  . Codeine Rash    ROS:  No fever, chills, URI symptoms, cough, shortness of breath, chest pain, palpitations. No GI or GU complaints. No headaches,  dizziness. No bleeding, bruising, rash.  Denies joint pains.   Moods are good overall, but +stressors (caring for father-in-law, nephew's issues).    Observations/Objective:  BP 136/81   Ht _0  (1.702 m)   Wt 207 lb 3.2 oz (94 kg)   BMI 32.45 kg/m   Wt Readings  from Last 3 Encounters:  03/13/19 207 lb 3.2 oz (94 kg)  09/11/18 212 lb (96.2 kg)  09/04/18 212 lb 12.8 oz (96.5 kg)   Well-appearing, pleasant female, in no distress. She is alert, oriented, in good spirits. Cranial nerves are grossly intact. Normal speech, eye contact, mood and affect. Limited exam due to virtual nature of the visit  Lab Results  Component Value Date   HGBA1C 6.8 (H) 03/07/2019     Chemistry      Component Value Date/Time   NA 137 03/07/2019 0830   K 4.5 03/07/2019 0830   CL 101 03/07/2019 0830   CO2 25 03/07/2019 0830   BUN 15 03/07/2019 0830   CREATININE 0.72 03/07/2019 0830   CREATININE 0.75 12/29/2016 0754      Component Value Date/Time   CALCIUM 9.1 03/07/2019 0830   ALKPHOS 65 03/07/2019 0830   AST 30 03/07/2019 0830   ALT 29 03/07/2019 0830   BILITOT 0.5 03/07/2019 0830     Fasting glucose 134  Urine microalbumin/Cr ratio 8 (normal)  Lab Results  Component Value Date   CHOL 138 03/07/2019   HDL 42 03/07/2019   LDLCALC 57 03/07/2019   TRIG 243 (H) 03/07/2019   CHOLHDL 3.3 03/07/2019     Assessment and Plan:  Type 2 diabetes mellitus with other specified complication, without long-term current use of insulin (HCC) - controlled; cont metformin 1000/500; counseled re: exercise, diet, wt loss  Hyperlipidemia associated with type 2 diabetes mellitus (HCC) - TG elevated; trial Vascepa in place of OTC omega-3. May need low dose fibrate and/or nutritionist if still elevated  Mixed hyperlipidemia - Plan: icosapent Ethyl (VASCEPA) 1 g capsule  Essential hypertension - borderline today, but stressful. Reminded to monitor periodically.  Encouraged low Na diet, exercise, wt loss  Class 1 obesity due to excess calories with serious comorbidity and body mass index (BMI) of 32.0 to 32.9 in adult  Pt unable to come for in-person visit due to having to stay with her father-in-law.  Will need in-person visit next visit--past due for diabetic foot exam  (last seen here 12/2017, but saw GYN 01/2019). Reminded to schedule diabetic eye exam in January  No refills needed (crestor done earlier this week) TG remains over 200-- Options discussed include change to Vascepa, low dose fibrate, dietician--will try Vascepa instead of fish oil.  Info given on lowfat diet. Advised she can go to website for copay card.  Check BP weekly Stress-reduction, exercise Mindfulness handout  6 month in person med check with labs prior--A1c, c-met, lipids, TSH   Follow Up Instructions:    I discussed the assessment and treatment plan with the patient. The patient was provided an opportunity to ask questions and all were answered. The patient agreed with the plan and demonstrated an understanding of the instructions.   The patient was advised to call back or seek an in-person evaluation if the symptoms worsen or if the condition fails to improve as anticipated.  I provided 25 minutes of non-face-to-face time during this encounter.   Vikki Ports, MD

## 2019-03-13 ENCOUNTER — Ambulatory Visit (INDEPENDENT_AMBULATORY_CARE_PROVIDER_SITE_OTHER): Payer: 59 | Admitting: Family Medicine

## 2019-03-13 ENCOUNTER — Other Ambulatory Visit: Payer: Self-pay

## 2019-03-13 ENCOUNTER — Encounter: Payer: Self-pay | Admitting: Family Medicine

## 2019-03-13 VITALS — BP 136/81 | Ht 67.0 in | Wt 207.2 lb

## 2019-03-13 DIAGNOSIS — I10 Essential (primary) hypertension: Secondary | ICD-10-CM

## 2019-03-13 DIAGNOSIS — E785 Hyperlipidemia, unspecified: Secondary | ICD-10-CM

## 2019-03-13 DIAGNOSIS — E6609 Other obesity due to excess calories: Secondary | ICD-10-CM | POA: Diagnosis not present

## 2019-03-13 DIAGNOSIS — E782 Mixed hyperlipidemia: Secondary | ICD-10-CM | POA: Diagnosis not present

## 2019-03-13 DIAGNOSIS — Z5181 Encounter for therapeutic drug level monitoring: Secondary | ICD-10-CM

## 2019-03-13 DIAGNOSIS — Z6832 Body mass index (BMI) 32.0-32.9, adult: Secondary | ICD-10-CM

## 2019-03-13 DIAGNOSIS — E1169 Type 2 diabetes mellitus with other specified complication: Secondary | ICD-10-CM | POA: Diagnosis not present

## 2019-03-13 MED ORDER — ICOSAPENT ETHYL 1 G PO CAPS: 2.0000 g | ORAL_CAPSULE | Freq: Two times a day (BID) | ORAL | 1 refills | Status: DC

## 2019-03-13 NOTE — Patient Instructions (Addendum)
We are replacing your over-the-counter fish oil with Vascepa.  Take 2 pills twice daily.  No other medication changes were made today. If the Vascepa copay is expensive, you can go online ([email or web address]) to look for coupons.  Try and cut back on sugar in your diet, and continue to work on weight loss. Daily exercise can help with your stress, as well as weight.  See additional information below.  Please try and check your blood pressure weekly, and periodically check sugars.  Record these on a paper, which you should bring to your visits.  Feel free to send these values in a message if they are elevated, for my review.    Mindfulness-Based Stress Reduction Mindfulness-based stress reduction (MBSR) is a program that helps people learn to practice mindfulness. Mindfulness is the practice of intentionally paying attention to the present moment. It can be learned and practiced through techniques such as education, breathing exercises, meditation, and yoga. MBSR includes several mindfulness techniques in one program. MBSR works best when you understand the treatment, are willing to try new things, and can commit to spending time practicing what you learn. MBSR training may include learning about:  How your emotions, thoughts, and reactions affect your body.  New ways to respond to things that cause negative thoughts to start (triggers).  How to notice your thoughts and let go of them.  Practicing awareness of everyday things that you normally do without thinking.  The techniques and goals of different types of meditation. What are the benefits of MBSR? MBSR can have many benefits, which include helping you to:  Develop self-awareness. This refers to knowing and understanding yourself.  Learn skills and attitudes that help you to participate in your own health care.  Learn new ways to care for yourself.  Be more accepting about how things are, and let things go.  Be less judgmental and  approach things with an open mind.  Be patient with yourself and trust yourself more. MBSR has also been shown to:  Reduce negative emotions, such as depression and anxiety.  Improve memory and focus.  Change how you sense and approach pain.  Boost your body's ability to fight infections.  Help you connect better with other people.  Improve your sense of well-being. Follow these instructions at home:   Find a local in-person or online MBSR program.  Set aside some time regularly for mindfulness practice.  Find a mindfulness practice that works best for you. This may include one or more of the following: ? Meditation. Meditation involves focusing your mind on a certain thought or activity. ? Breathing awareness exercises. These help you to stay present by focusing on your breath. ? Body scan. For this practice, you lie down and pay attention to each part of your body from head to toe. You can identify tension and soreness and intentionally relax parts of your body. ? Yoga. Yoga involves stretching and breathing, and it can improve your ability to move and be flexible. It can also provide an experience of testing your body's limits, which can help you release stress. ? Mindful eating. This way of eating involves focusing on the taste, texture, color, and smell of each bite of food. Because this slows down eating and helps you feel full sooner, it can be an important part of a weight-loss plan.  Find a podcast or recording that provides guidance for breathing awareness, body scan, or meditation exercises. You can listen to these any time when you have a  free moment to rest without distractions.  Follow your treatment plan as told by your health care provider. This may include taking regular medicines and making changes to your diet or lifestyle as recommended. How to practice mindfulness To do a basic awareness exercise:  Find a comfortable place to sit.  Pay attention to the  present moment. Observe your thoughts, feelings, and surroundings just as they are.  Avoid placing judgment on yourself, your feelings, or your surroundings. Make note of any judgment that comes up, and let it go.  Your mind may wander, and that is okay. Make note of when your thoughts drift, and return your attention to the present moment. To do basic mindfulness meditation:  Find a comfortable place to sit. This may include a stable chair or a firm floor cushion. ? Sit upright with your back straight. Let your arms fall next to your side with your hands resting on your legs. ? If sitting in a chair, rest your feet flat on the floor. ? If sitting on a cushion, cross your legs in front of you.  Keep your head in a neutral position with your chin dropped slightly. Relax your jaw and rest the tip of your tongue on the roof of your mouth. Drop your gaze to the floor. You can close your eyes if you like.  Breathe normally and pay attention to your breath. Feel the air moving in and out of your nose. Feel your belly expanding and relaxing with each breath.  Your mind may wander, and that is okay. Make note of when your thoughts drift, and return your attention to your breath.  Avoid placing judgment on yourself, your feelings, or your surroundings. Make note of any judgment or feelings that come up, let them go, and bring your attention back to your breath.  When you are ready, lift your gaze or open your eyes. Pay attention to how your body feels after the meditation. Where to find more information You can find more information about MBSR from:  Your health care provider.  Community-based meditation centers or programs.  Programs offered near you. Summary  Mindfulness-based stress reduction (MBSR) is a program that teaches you how to intentionally pay attention to the present moment. It is used with other treatments to help you cope better with daily stress, emotions, and pain.  MBSR  focuses on developing self-awareness, which allows you to respond to life stress without judgment or negative emotions.  MBSR programs may involve learning different mindfulness practices, such as breathing exercises, meditation, yoga, body scan, or mindful eating. Find a mindfulness practice that works best for you, and set aside time for it on a regular basis. This information is not intended to replace advice given to you by your health care provider. Make sure you discuss any questions you have with your health care provider. Document Released: 07/20/2016 Document Revised: 02/23/2017 Document Reviewed: 07/20/2016 Elsevier Patient Education  2020 [employer]    High Triglycerides Eating Plan Triglycerides are a type of fat in the blood. High levels of triglycerides can increase your risk of heart disease and stroke. If your triglyceride levels are high, choosing the right foods can help lower your triglycerides and keep your heart healthy. Work with your health care provider or a diet and nutrition specialist (dietitian) to develop an eating plan that is right for you. What are tips for following this plan? General guidelines   Lose weight, if you are overweight. For most people, losing 5-10  lbs (2-5 kg) helps lower triglyceride levels. A weight-loss plan may include. ? 30 minutes of exercise at least 5 days a week. ? Reducing the amount of calories, sugar, and fat you eat.  Eat a wide variety of fresh fruits, vegetables, and whole grains. These foods are high in fiber.  Eat foods that contain healthy fats, such as fatty fish, nuts, seeds, and olive oil.  Avoid foods that are high in added sugar, added salt (sodium), saturated fat, and trans fat.  Avoid low-fiber, refined carbohydrates such as white bread, crackers, noodles, and white rice.  Avoid foods with partially hydrogenated oils (trans fats), such as fried foods or stick margarine.  Limit alcohol intake to no more than 1  drink a day for nonpregnant women and 2 drinks a day for men. One drink equals 12 oz of beer, 5 oz of wine, or 1 oz of hard liquor. Your health care provider may recommend that you drink less depending on your overall health. Reading food labels  Check food labels for the amount of saturated fat. Choose foods with no or very little saturated fat.  Check food labels for the amount of trans fat. Choose foods with no trans fat.  Check food labels for the amount of cholesterol. Choose foods low in cholesterol. Ask your dietitian how much cholesterol you should have each day.  Check food labels for the amount of sodium. Choose foods with less than 140 milligrams (mg) per serving. Shopping  Buy dairy products labeled as nonfat (skim) or low-fat (1%).  Avoid buying processed or prepackaged foods. These are often high in added sugar, sodium, and fat. Cooking  Choose healthy fats when cooking, such as olive oil or canola oil.  Cook foods using lower fat methods, such as baking, broiling, boiling, or grilling.  Make your own sauces, dressings, and marinades when possible, instead of buying them. Store-bought sauces, dressings, and marinades are often high in sodium and sugar. Meal planning  Eat more home-cooked food and less restaurant, buffet, and fast food.  Eat fatty fish at least 2 times each week. Examples of fatty fish include salmon, trout, mackerel, tuna, and herring.  If you eat whole eggs, do not eat more than 3 egg yolks per week. What foods are recommended? The items listed may not be a complete list. Talk with your dietitian about what dietary choices are best for you. Grains Whole wheat or whole grain breads, crackers, cereals, and pasta. Unsweetened oatmeal. Bulgur. Barley. Quinoa. Brown rice. Whole wheat flour tortillas. Vegetables Fresh or frozen vegetables. Low-sodium canned vegetables. Fruits All fresh, canned (in natural juice), or frozen fruits. Meats and other  protein foods Skinless chicken or [place]. Ground chicken or [place]. Lean cuts of pork, trimmed of fat. Fish and seafood, especially salmon, trout, and herring. Egg whites. Dried beans, peas, or lentils. Unsalted nuts or seeds. Unsalted canned beans. Natural peanut or almond butter. Dairy Low-fat dairy products. Skim or low-fat (1%) milk. Reduced fat (2%) and low-sodium cheese. Low-fat ricotta cheese. Low-fat cottage cheese. Plain, low-fat yogurt. Fats and oils Tub margarine without trans fats. Light or reduced-fat mayonnaise. Light or reduced-fat salad dressings. Avocado. Safflower, olive, sunflower, soybean, and canola oils. What foods are not recommended? The items listed may not be a complete list. Talk with your dietitian about what dietary choices are best for you. Grains White bread. White (regular) pasta. White rice. Cornbread. Bagels. Pastries. Crackers that contain trans fat. Vegetables Creamed or fried vegetables. Vegetables in a cheese sauce.  Fruits Sweetened dried fruit. Canned fruit in syrup. Fruit juice. Meats and other protein foods Fatty cuts of meat. Ribs. Chicken wings. Berniece Salines. Sausage. Bologna. Salami. Chitterlings. Fatback. Hot dogs. Bratwurst. Packaged lunch meats. Dairy Whole or reduced-fat (2%) milk. Half-and-half. Cream cheese. Full-fat or sweetened yogurt. Full-fat cheese. Nondairy creamers. Whipped toppings. Processed cheese or cheese spreads. Cheese curds. Beverages Alcohol. Sweetened drinks, such as soda, lemonade, fruit drinks, or punches. Fats and oils Butter. Stick margarine. Lard. Shortening. Ghee. Bacon fat. Tropical oils, such as coconut, palm kernel, or palm oils. Sweets and desserts Corn syrup. Sugars. Honey. Molasses. Candy. Jam and jelly. Syrup. Sweetened cereals. Cookies. Pies. Cakes. Donuts. Muffins. Ice cream. Condiments Store-bought sauces, dressings, and marinades that are high in sugar, such as ketchup and barbecue sauce. Summary  High levels of  triglycerides can increase the risk of heart disease and stroke. Choosing the right foods can help lower your triglycerides.  Eat plenty of fresh fruits, vegetables, and whole grains. Choose low-fat dairy and lean meats. Eat fatty fish at least twice a week.  Avoid processed and prepackaged foods with added sugar, sodium, saturated fat, and trans fat.  If you need suggestions or have questions about what types of food are good for you, talk with your health care provider or a dietitian. This information is not intended to replace advice given to you by your health care provider. Make sure you discuss any questions you have with your health care provider. Document Released: 12/30/2003 Document Revised: 02/23/2017 Document Reviewed: 05/16/2016 Elsevier Patient Education  2020 [employer].

## 2019-03-18 NOTE — Progress Notes (Signed)
Done

## 2019-04-08 ENCOUNTER — Other Ambulatory Visit: Payer: Self-pay | Admitting: Family Medicine

## 2019-04-08 DIAGNOSIS — E119 Type 2 diabetes mellitus without complications: Secondary | ICD-10-CM

## 2019-05-18 ENCOUNTER — Other Ambulatory Visit: Payer: Self-pay | Admitting: Family Medicine

## 2019-05-18 DIAGNOSIS — I10 Essential (primary) hypertension: Secondary | ICD-10-CM

## 2019-06-20 DIAGNOSIS — M19011 Primary osteoarthritis, right shoulder: Secondary | ICD-10-CM | POA: Insufficient documentation

## 2019-08-20 ENCOUNTER — Other Ambulatory Visit: Payer: Self-pay | Admitting: Family Medicine

## 2019-08-20 DIAGNOSIS — I10 Essential (primary) hypertension: Secondary | ICD-10-CM

## 2019-08-20 MED ORDER — LISINOPRIL 20 MG PO TABS: 20.0000 mg | ORAL_TABLET | Freq: Every day | ORAL | 0 refills | Status: DC

## 2019-08-30 ENCOUNTER — Encounter: Payer: Self-pay | Admitting: Family Medicine

## 2019-09-01 ENCOUNTER — Other Ambulatory Visit: Payer: Self-pay

## 2019-09-01 DIAGNOSIS — E119 Type 2 diabetes mellitus without complications: Secondary | ICD-10-CM

## 2019-09-01 MED ORDER — METFORMIN HCL 500 MG PO TABS: ORAL_TABLET | ORAL | 1 refills | Status: DC

## 2019-09-04 ENCOUNTER — Other Ambulatory Visit: Payer: Self-pay | Admitting: Family Medicine

## 2019-09-04 DIAGNOSIS — E782 Mixed hyperlipidemia: Secondary | ICD-10-CM

## 2019-09-12 ENCOUNTER — Other Ambulatory Visit: Payer: No Typology Code available for payment source

## 2019-09-12 ENCOUNTER — Other Ambulatory Visit: Payer: Self-pay

## 2019-09-12 DIAGNOSIS — Z5181 Encounter for therapeutic drug level monitoring: Secondary | ICD-10-CM

## 2019-09-12 DIAGNOSIS — E782 Mixed hyperlipidemia: Secondary | ICD-10-CM

## 2019-09-12 DIAGNOSIS — I10 Essential (primary) hypertension: Secondary | ICD-10-CM

## 2019-09-12 DIAGNOSIS — E1169 Type 2 diabetes mellitus with other specified complication: Secondary | ICD-10-CM

## 2019-09-13 LAB — COMPREHENSIVE METABOLIC PANEL
ALT: 23 IU/L (ref 0–32)
AST: 24 IU/L (ref 0–40)
Albumin/Globulin Ratio: 1.6 (ref 1.2–2.2)
Albumin: 4.2 g/dL (ref 3.8–4.9)
Alkaline Phosphatase: 58 IU/L (ref 48–121)
BUN/Creatinine Ratio: 16 (ref 9–23)
BUN: 11 mg/dL (ref 6–24)
Bilirubin Total: 0.4 mg/dL (ref 0.0–1.2)
CO2: 23 mmol/L (ref 20–29)
Calcium: 9.3 mg/dL (ref 8.7–10.2)
Chloride: 102 mmol/L (ref 96–106)
Creatinine, Ser: 0.68 mg/dL (ref 0.57–1.00)
GFR calc Af Amer: 114 mL/min/{1.73_m2} (ref >59)
GFR calc non Af Amer: 99 mL/min/{1.73_m2} (ref >59)
Globulin, Total: 2.6 g/dL (ref 1.5–4.5)
Glucose: 147 mg/dL — ABNORMAL HIGH (ref 65–99)
Potassium: 4.5 mmol/L (ref 3.5–5.2)
Sodium: 139 mmol/L (ref 134–144)
Total Protein: 6.8 g/dL (ref 6.0–8.5)

## 2019-09-13 LAB — LIPID PANEL
Chol/HDL Ratio: 3.3 ratio (ref 0.0–4.4)
Cholesterol, Total: 133 mg/dL (ref 100–199)
HDL: 40 mg/dL (ref >39)
LDL Chol Calc (NIH): 58 mg/dL (ref 0–99)
Triglycerides: 214 mg/dL — ABNORMAL HIGH (ref 0–149)
VLDL Cholesterol Cal: 35 mg/dL (ref 5–40)

## 2019-09-13 LAB — TSH: TSH: 4.08 u[IU]/mL (ref 0.450–4.500)

## 2019-09-13 LAB — HEMOGLOBIN A1C
Est. average glucose Bld gHb Est-mCnc: 151 mg/dL
Hgb A1c MFr Bld: 6.9 % — ABNORMAL HIGH (ref 4.8–5.6)

## 2019-09-18 ENCOUNTER — Encounter: Payer: 59 | Admitting: Family Medicine

## 2019-09-19 LAB — HM DIABETES EYE EXAM

## 2019-09-24 NOTE — Progress Notes (Signed)
Chief Complaint  Patient presents with  . Hypertension    nonfasting med check. No new concerns. Is taking her metformin correctly 2qam and 1qpm.     Patient presents for 6 month med check.  She had labs done prior to her visit, see below.  Hyperlipidemia: Patientcontinues onCrestor 37m, reports compliance with medication and denies side effects. Her TG were over 200 on last check, and she was changed from OTC fish oil to Vascepa 2gm BID.  She is tolerating this without side effects. Shetries to follow a low cholesterol diet. Drinks decaf coffee, uses Sweet & Low, not sugar.  Only a little sweet tea with dinner (a few swallows, 1/2 glass or less). She cut back on sweets. Fewer sweets in the house (now living with father-in-law, who is diabetic).  Admits that they snack on ice cream. Air-fryer at home.  Hypertension follow-up: She doesn't check BP at home. Denies dizziness, headaches, chest pain. Denies side effects of medications. She is compliant in taking lisinopril 21mdaily.  Diabetes follow-up: She tries to follow alowfat, low sugar diet--see above, some snacking on ice cream. She is compliant with taking metformin1000/500 and denies side effects. Blood sugars are running 130-140's in the morning, higher than previously. Doesn't check other times of the day. Denies hypoglycemia,polydipsia and polyuria, numbness, tingling, neuropathy. Had her diabetic eye exam6/25/2021, no retinopathy. Shechecks feet regularly without concerns. Starting walking in the mornings, 15 minutes.  Moved in with her father-in-law 01/19/2019. Stage 2 dementia, needs her or husband with him all day long. She works from all all week now. Nephew is on drugs, now living with his father rather than next door, so she has less stress    PMH, PSH, SH reviewed  Outpatient Encounter Medications as of 09/25/2019  Medication Sig Note  . estradiol (ESTRACE) 0.5 MG tablet Take 0.5 mg by mouth daily.    . Marland Kitchenisinopril (ZESTRIL) 20 MG tablet Take 1 tablet (20 mg total) by mouth daily.   . Multiple Vitamins-Minerals (MULTIVITAMIN WITH MINERALS) tablet Take 1 tablet by mouth daily.   . rosuvastatin (CRESTOR) 20 MG tablet Take 1 tablet (20 mg total) by mouth daily.   . Marland KitchenASCEPA 1 g capsule TAKE 2 CAPSULES BY MOUTH TWICE A DAY   . VITAMIN D, CHOLECALCIFEROL, PO Take 5,000 Int'l Units by mouth daily.   . [DISCONTINUED] metFORMIN (GLUCOPHAGE) 500 MG tablet TAKE 1 TABLET (500 MG TOTAL) BY MOUTH 2 (TWO) TIMES DAILY WITH A MEAL. 09/25/2019: Is taking 2qam and 1pm  . [DISCONTINUED] Omega-3 Fatty Acids (FISH OIL) 1000 MG CPDR Take 2 capsules by mouth 2 (two) times daily.   . [DISCONTINUED] rosuvastatin (CRESTOR) 20 MG tablet TAKE 1 TABLET BY MOUTH EVERY DAY   . metFORMIN (GLUCOPHAGE) 1000 MG tablet Take 1 tablet (1,000 mg total) by mouth 2 (two) times daily with a meal.    No facility-administered encounter medications on file as of 09/25/2019.   (taking 1000/500 of metformin prior to today's visit, not 100061mID).  Allergies  Allergen Reactions  . Codeine Rash    ROS: no fever, chills, URI symptoms, headaches, chest pain, shortness of breath, GI or GU complaints, bleeding, bruising, rash. Moods are good. See HPI   PHYSICAL EXAM:  BP 120/76   Pulse 80   Ht 5' 6.5" (1.689 m)   Wt 213 lb (96.6 kg)   BMI 33.86 kg/m   Wt Readings from Last 3 Encounters:  09/25/19 213 lb (96.6 kg)  03/13/19 207 lb 3.2 oz (  94 kg)  09/11/18 212 lb (96.2 kg)   Well appearing, pleasant, obese female in no distress, in good spirits HEENT: PERRL, EOMI, conjunctiva and sclera are clear. Wearing mask Neck: no lymphadenopathy thyromegaly or carotid bruit Heart: regular rate and rhythm Lungs: clear bilaterally Back: no CVA or spinal tenderness Abdomen: soft, nontender, no mass Extremities: no edema, 2+ pulses, normal sensation to monofilament Skin: normal turgor, no rashes. Quite a few mosquito bites on LLE, none  appear infected Neuro: Normal strength, sensation, gait Psych: normal mood, affect, hygiene and grooming    Chemistry      Component Value Date/Time   NA 139 09/12/2019 0819   K 4.5 09/12/2019 0819   CL 102 09/12/2019 0819   CO2 23 09/12/2019 0819   BUN 11 09/12/2019 0819   CREATININE 0.68 09/12/2019 0819   CREATININE 0.75 12/29/2016 0754      Component Value Date/Time   CALCIUM 9.3 09/12/2019 0819   ALKPHOS 58 09/12/2019 0819   AST 24 09/12/2019 0819   ALT 23 09/12/2019 0819   BILITOT 0.4 09/12/2019 0819     Fasting glucose 147  Lab Results  Component Value Date   HGBA1C 6.9 (H) 09/12/2019   Lab Results  Component Value Date   TSH 4.080 09/12/2019   Lab Results  Component Value Date   CHOL 133 09/12/2019   HDL 40 09/12/2019   LDLCALC 58 09/12/2019   TRIG 214 (H) 09/12/2019   CHOLHDL 3.3 09/12/2019    ASSESSMENT/PLAN:  Type 2 diabetes mellitus with other specified complication, without long-term current use of insulin (HCC) - increase metformin to 1075m BID. Diet reviewed, wt loss encouraged - Plan: metFORMIN (GLUCOPHAGE) 1000 MG tablet  Hyperlipidemia associated with type 2 diabetes mellitus (HCC)  Mixed hyperlipidemia - TG above goal, LDL at goal. Cont statin and Vascepa. Diet reviewed in detail. Recheck 6 mos - Plan: rosuvastatin (CRESTOR) 20 MG tablet  Essential hypertension - well controlled  Class 1 obesity due to excess calories with serious comorbidity and body mass index (BMI) of 33.0 to 33.9 in adult - comorbidity--DM, HTN, lipids. Counseled re: risks, diet, exercise  Vaccine counseling - counseled in detail regarding COVID vaccine, as pt has not gotten and does not want it. Encouraged to get  F/u 6 months with fasting labs prior--visit can be virtual if preferred by patient.  A1c, lipids, c-met, CBC, B12,  microalb (doesn't do CPE, sees GYN regularly)

## 2019-09-25 ENCOUNTER — Ambulatory Visit (INDEPENDENT_AMBULATORY_CARE_PROVIDER_SITE_OTHER): Payer: No Typology Code available for payment source | Admitting: Family Medicine

## 2019-09-25 ENCOUNTER — Other Ambulatory Visit: Payer: Self-pay

## 2019-09-25 ENCOUNTER — Encounter: Payer: Self-pay | Admitting: Family Medicine

## 2019-09-25 VITALS — BP 120/76 | HR 80 | Ht 66.5 in | Wt 213.0 lb

## 2019-09-25 DIAGNOSIS — E782 Mixed hyperlipidemia: Secondary | ICD-10-CM | POA: Diagnosis not present

## 2019-09-25 DIAGNOSIS — E6609 Other obesity due to excess calories: Secondary | ICD-10-CM | POA: Diagnosis not present

## 2019-09-25 DIAGNOSIS — I10 Essential (primary) hypertension: Secondary | ICD-10-CM

## 2019-09-25 DIAGNOSIS — Z7185 Encounter for immunization safety counseling: Secondary | ICD-10-CM

## 2019-09-25 DIAGNOSIS — Z6833 Body mass index (BMI) 33.0-33.9, adult: Secondary | ICD-10-CM

## 2019-09-25 DIAGNOSIS — E785 Hyperlipidemia, unspecified: Secondary | ICD-10-CM

## 2019-09-25 DIAGNOSIS — E1169 Type 2 diabetes mellitus with other specified complication: Secondary | ICD-10-CM | POA: Diagnosis not present

## 2019-09-25 DIAGNOSIS — Z7189 Other specified counseling: Secondary | ICD-10-CM

## 2019-09-25 DIAGNOSIS — Z5181 Encounter for therapeutic drug level monitoring: Secondary | ICD-10-CM

## 2019-09-25 MED ORDER — ROSUVASTATIN CALCIUM 20 MG PO TABS: 20.0000 mg | ORAL_TABLET | Freq: Every day | ORAL | 1 refills | Status: DC

## 2019-09-25 MED ORDER — METFORMIN HCL 1000 MG PO TABS: 1000.0000 mg | ORAL_TABLET | Freq: Two times a day (BID) | ORAL | 1 refills | Status: DC

## 2019-09-25 NOTE — Patient Instructions (Signed)
We are increasing the metformin to 1000mg  twice daily.  You can take 2 twice daily of the 500mg  tablets that you recently refilled.  The higher dose was sent to your pharmacy for when you need it. Your triglycerides were still above goal. Continue your current medications, but work on cutting back on some of the sweets (ice cream). Continue to get daily exercise--shoot for 30 minutes every day. Work on losing some weight.

## 2019-09-26 ENCOUNTER — Encounter: Payer: Self-pay | Admitting: Family Medicine

## 2019-11-18 ENCOUNTER — Encounter: Payer: Self-pay | Admitting: Family Medicine

## 2019-11-18 DIAGNOSIS — E782 Mixed hyperlipidemia: Secondary | ICD-10-CM

## 2019-11-18 DIAGNOSIS — I10 Essential (primary) hypertension: Secondary | ICD-10-CM

## 2019-11-18 MED ORDER — LISINOPRIL 20 MG PO TABS: 20.0000 mg | ORAL_TABLET | Freq: Every day | ORAL | 0 refills | Status: DC

## 2019-11-18 MED ORDER — VASCEPA 1 G PO CAPS: 2.0000 g | ORAL_CAPSULE | Freq: Two times a day (BID) | ORAL | 0 refills | Status: DC

## 2019-12-31 ENCOUNTER — Other Ambulatory Visit: Payer: Self-pay

## 2019-12-31 ENCOUNTER — Other Ambulatory Visit (INDEPENDENT_AMBULATORY_CARE_PROVIDER_SITE_OTHER): Payer: No Typology Code available for payment source

## 2019-12-31 DIAGNOSIS — Z23 Encounter for immunization: Secondary | ICD-10-CM

## 2020-02-11 ENCOUNTER — Other Ambulatory Visit: Payer: Self-pay | Admitting: Family Medicine

## 2020-02-11 DIAGNOSIS — I10 Essential (primary) hypertension: Secondary | ICD-10-CM

## 2020-02-12 MED ORDER — LISINOPRIL 20 MG PO TABS: 20.0000 mg | ORAL_TABLET | Freq: Every day | ORAL | 0 refills | Status: DC

## 2020-03-04 ENCOUNTER — Encounter: Payer: Self-pay | Admitting: Family Medicine

## 2020-03-04 ENCOUNTER — Other Ambulatory Visit: Payer: Self-pay | Admitting: *Deleted

## 2020-03-04 DIAGNOSIS — E782 Mixed hyperlipidemia: Secondary | ICD-10-CM

## 2020-03-04 MED ORDER — VASCEPA 1 G PO CAPS: 2.0000 g | ORAL_CAPSULE | Freq: Two times a day (BID) | ORAL | 0 refills | Status: DC

## 2020-03-10 ENCOUNTER — Other Ambulatory Visit: Payer: Self-pay | Admitting: Family Medicine

## 2020-03-10 DIAGNOSIS — E1169 Type 2 diabetes mellitus with other specified complication: Secondary | ICD-10-CM

## 2020-04-01 ENCOUNTER — Other Ambulatory Visit: Payer: No Typology Code available for payment source

## 2020-04-01 ENCOUNTER — Other Ambulatory Visit: Payer: Self-pay

## 2020-04-01 DIAGNOSIS — E782 Mixed hyperlipidemia: Secondary | ICD-10-CM

## 2020-04-01 DIAGNOSIS — Z5181 Encounter for therapeutic drug level monitoring: Secondary | ICD-10-CM

## 2020-04-01 DIAGNOSIS — E1169 Type 2 diabetes mellitus with other specified complication: Secondary | ICD-10-CM

## 2020-04-01 DIAGNOSIS — I10 Essential (primary) hypertension: Secondary | ICD-10-CM

## 2020-04-02 LAB — LIPID PANEL
Chol/HDL Ratio: 3.4 ratio (ref 0.0–4.4)
Cholesterol, Total: 135 mg/dL (ref 100–199)
HDL: 40 mg/dL (ref >39)
LDL Chol Calc (NIH): 62 mg/dL (ref 0–99)
Triglycerides: 202 mg/dL — ABNORMAL HIGH (ref 0–149)
VLDL Cholesterol Cal: 33 mg/dL (ref 5–40)

## 2020-04-02 LAB — CBC WITH DIFFERENTIAL/PLATELET
Basophils Absolute: 0 10*3/uL (ref 0.0–0.2)
Basos: 1 %
EOS (ABSOLUTE): 0.3 10*3/uL (ref 0.0–0.4)
Eos: 4 %
Hematocrit: 39.6 % (ref 34.0–46.6)
Hemoglobin: 13.5 g/dL (ref 11.1–15.9)
Immature Grans (Abs): 0 10*3/uL (ref 0.0–0.1)
Immature Granulocytes: 0 %
Lymphocytes Absolute: 2.7 10*3/uL (ref 0.7–3.1)
Lymphs: 36 %
MCH: 28.5 pg (ref 26.6–33.0)
MCHC: 34.1 g/dL (ref 31.5–35.7)
MCV: 84 fL (ref 79–97)
Monocytes Absolute: 0.5 10*3/uL (ref 0.1–0.9)
Monocytes: 7 %
Neutrophils Absolute: 4 10*3/uL (ref 1.4–7.0)
Neutrophils: 52 %
Platelets: 284 10*3/uL (ref 150–450)
RBC: 4.74 x10E6/uL (ref 3.77–5.28)
RDW: 12.5 % (ref 11.7–15.4)
WBC: 7.5 10*3/uL (ref 3.4–10.8)

## 2020-04-02 LAB — COMPREHENSIVE METABOLIC PANEL
ALT: 30 IU/L (ref 0–32)
AST: 28 IU/L (ref 0–40)
Albumin/Globulin Ratio: 1.5 (ref 1.2–2.2)
Albumin: 4.4 g/dL (ref 3.8–4.9)
Alkaline Phosphatase: 60 IU/L (ref 44–121)
BUN/Creatinine Ratio: 17 (ref 9–23)
BUN: 12 mg/dL (ref 6–24)
Bilirubin Total: 0.5 mg/dL (ref 0.0–1.2)
CO2: 23 mmol/L (ref 20–29)
Calcium: 9.6 mg/dL (ref 8.7–10.2)
Chloride: 102 mmol/L (ref 96–106)
Creatinine, Ser: 0.72 mg/dL (ref 0.57–1.00)
GFR calc Af Amer: 108 mL/min/{1.73_m2} (ref >59)
GFR calc non Af Amer: 94 mL/min/{1.73_m2} (ref >59)
Globulin, Total: 2.9 g/dL (ref 1.5–4.5)
Glucose: 140 mg/dL — ABNORMAL HIGH (ref 65–99)
Potassium: 4.5 mmol/L (ref 3.5–5.2)
Sodium: 141 mmol/L (ref 134–144)
Total Protein: 7.3 g/dL (ref 6.0–8.5)

## 2020-04-02 LAB — VITAMIN B12: Vitamin B-12: 418 pg/mL (ref 232–1245)

## 2020-04-02 LAB — MICROALBUMIN / CREATININE URINE RATIO
Creatinine, Urine: 208.4 mg/dL
Microalb/Creat Ratio: 26 mg/g creat (ref 0–29)
Microalbumin, Urine: 54.9 ug/mL

## 2020-04-02 LAB — HEMOGLOBIN A1C
Est. average glucose Bld gHb Est-mCnc: 154 mg/dL
Hgb A1c MFr Bld: 7 % — ABNORMAL HIGH (ref 4.8–5.6)

## 2020-04-04 NOTE — Progress Notes (Signed)
Start time: 12pm End time: 12:29  Patient had internet issues, was getting error message. Unable to connect to video  Virtual Visit via Telephone Note  I connected with Judith Nolan on 04/05/20 by telephone and verified that I am speaking with the correct person using two identifiers.  Location: Patient: home Provider: office   I discussed the limitations of evaluation and management by telemedicine and the availability of in person appointments. The patient expressed understanding and agreed to proceed.  History of Present Illness:  Chief Complaint  Patient presents with  . Hypertension    VIRTUAL med check, no new concerns.    Patient presents for 6 month med check.  She had labs done prior to her visit, see below.  Moved in with her father-in-law 01/19/2019. He has dementia, needs her or husband with him all day long. She works from all all week now. This visit is being performed virtually in order to not leave her father-in-law alone.  She reports that the last 2 months have been very rough, under a lot of stress. Her mother had COVID pneumonia in 02-23-2023, passed away March 18, 2023. Had to move out of her house, just finished yesterday. She has been eating a lot of fast food during this time (passing [employer] on the way to her house). She did have some good news--has a new grandson, 92 days old today. Her husband also had COVID in December.  She and her father-in-law remained well, though she reports she lost her taste and smell the day her mother died 18-Mar-2023). Husband tested + 12/19, she tested 12/23, and was negative.  She still has not had COVID vaccine, and doesn't want one.  Hyperlipidemia: Patientcontinues onCrestor 20mg , and Vascepa 2gm BID.  She reports compliance with medication and denies side effects.  Drinks decaf coffee, uses Sweet & Low, not sugar.  Drinks water with dinner, drinks some lemonade at lunch. Hasn't had much of an appetite recently.  She  has been eating more sweets and fast food as reported above, related to her mother's illness and travel back and forth to empty out her house.  Hypertension follow-up: She doesn't check BP at home (not until today). Denies dizziness, headaches, chest pain. Denies side effects of medications. She is compliant in taking lisinopril 20mg  daily.  Diabetes follow-up: She tries to follow alowfat, low sugar diet--see above, some snacking on ice cream, more fast foods, some lemonade. She is compliant with taking metformin--in July her dose was increased from1000/500 to 1000mg  BID.  She denies side effects. She denies any side effects. Blood sugars are running 130-140's in the morning. Doesn't check other times of the day. Denies hypoglycemia,polydipsia and polyuria, numbness, tingling, neuropathy. Had her diabetic eye exam6/25/2021, no retinopathy. Shechecks feet regularly without concerns. Hasn't been able to walk recently.   PMH, PSH, SH reviewed  Outpatient Encounter Medications as of 04/05/2020  Medication Sig  . estradiol (ESTRACE) 0.5 MG tablet Take 0.5 mg by mouth daily.  icosapent Ethyl (VASCEPA) 1 g capsule Vascepa 1 gram capsule  TAKE 2 CAPSULES BY MOUTH TWICE A DAY  . lisinopril (ZESTRIL) 20 MG tablet Take 1 tablet (20 mg total) by mouth daily.  . metFORMIN (GLUCOPHAGE) 1000 MG tablet TAKE 1 TABLET (1,000 MG TOTAL) BY MOUTH 2 (TWO) TIMES DAILY WITH A MEAL.  . Multiple Vitamins-Minerals (MULTIVITAMIN WITH MINERALS) tablet Take 1 tablet by mouth daily.  . rosuvastatin (CRESTOR) 20 MG tablet Take 1 tablet (20 mg total) by mouth daily.  09/21/2019  VITAMIN D, CHOLECALCIFEROL, PO Take 5,000 Int'l Units by mouth daily.  . [DISCONTINUED] VASCEPA 1 g capsule Take 2 capsules (2 g total) by mouth 2 (two) times daily.   No facility-administered encounter medications on file as of 04/05/2020.   Allergies  Allergen Reactions  . Codeine Rash    ROS: no fever, chills, URI symptoms, headaches,  chest pain, shortness of breath, GI or GU complaints, bleeding, bruising, rash. Loss of taste and smell, hasn't returned yet. Moods are good overall, stress just recently improved since getting moved out of the house yesterday. See HPI   PHYSICAL EXAM:  BP 129/81   Pulse 74   Ht 5\' 7"  (1.702 m)   Wt 209 lb (94.8 kg)   BMI 32.73 kg/m   Wt Readings from Last 3 Encounters:  04/05/20 209 lb (94.8 kg)  09/25/19 213 lb (96.6 kg)  03/13/19 207 lb 3.2 oz (94 kg)   Alert, oriented, pleasant. Normal speech. She seems to be in fair spirits. Exam is limited due to virtual nature of the visit.  Labs done prior to visit:    Chemistry      Component Value Date/Time   NA 141 04/01/2020 0844   K 4.5 04/01/2020 0844   CL 102 04/01/2020 0844   CO2 23 04/01/2020 0844   BUN 12 04/01/2020 0844   CREATININE 0.72 04/01/2020 0844   CREATININE 0.75 12/29/2016 0754      Component Value Date/Time   CALCIUM 9.6 04/01/2020 0844   ALKPHOS 60 04/01/2020 0844   AST 28 04/01/2020 0844   ALT 30 04/01/2020 0844   BILITOT 0.5 04/01/2020 0844     Fasting glucose 140 Urine microalb/Cr ratio 28  Lab Results  Component Value Date   HGBA1C 7.0 (H) 04/01/2020   Lab Results  Component Value Date   CHOL 135 04/01/2020   HDL 40 04/01/2020   LDLCALC 62 04/01/2020   TRIG 202 (H) 04/01/2020   CHOLHDL 3.4 04/01/2020   Lab Results  Component Value Date   WBC 7.5 04/01/2020   HGB 13.5 04/01/2020   HCT 39.6 04/01/2020   MCV 84 04/01/2020   PLT 284 04/01/2020   Lab Results  Component Value Date   VITAMINB12 418 04/01/2020    ASSESSMENT/PLAN:  Essential hypertension - controlled, continue current meds  Type 2 diabetes mellitus with other specified complication, without long-term current use of insulin (HCC) - borderline control, A1c at 7, slightly higher despite higher dose metformin. Reviewed diet, exercise, recheck 4 mos  Mixed hyperlipidemia - TG above goal, LDL at goal. Cont statin and  Vascepa. Diet reviewed in detail. Recheck 4 mos (recent poor diet) - Plan: rosuvastatin (CRESTOR) 20 MG tablet  Hyperlipidemia associated with type 2 diabetes mellitus (HCC)  Hypertension associated with diabetes (HCC)  Class 1 obesity due to excess calories with serious comorbidity and body mass index (BMI) of 32.0 to 32.9 in adult - counseled re: diet, exercise  COVID-19 vaccination declined - counseled, encouraged vaccination (even if she thinks she had it)  Recent loss of her mother (to COVID-related illness). Discussed grief counseling--hospice has already contacted her.  F/u 4 months (in person, in afternoon) with fasting labs prior-- A1c, lipids, glu   (doesn't do CPE, sees GYN regularly)   Follow Up Instructions:    I discussed the assessment and treatment plan with the patient. The patient was provided an opportunity to ask questions and all were answered. The patient agreed with the plan and demonstrated an understanding of  the instructions.   The patient was advised to call back or seek an in-person evaluation if the symptoms worsen or if the condition fails to improve as anticipated.  I spent 35  minutes dedicated to the care of this patient, including pre-visit review of records, face to face time, post-visit ordering of testing and documentation.    Lavonda Jumbo, MD

## 2020-04-05 ENCOUNTER — Encounter: Payer: Self-pay | Admitting: Family Medicine

## 2020-04-05 ENCOUNTER — Telehealth (INDEPENDENT_AMBULATORY_CARE_PROVIDER_SITE_OTHER): Payer: No Typology Code available for payment source | Admitting: Family Medicine

## 2020-04-05 ENCOUNTER — Other Ambulatory Visit: Payer: Self-pay

## 2020-04-05 VITALS — BP 129/81 | HR 74 | Ht 67.0 in | Wt 209.0 lb

## 2020-04-05 DIAGNOSIS — E1159 Type 2 diabetes mellitus with other circulatory complications: Secondary | ICD-10-CM | POA: Diagnosis not present

## 2020-04-05 DIAGNOSIS — Z2821 Immunization not carried out because of patient refusal: Secondary | ICD-10-CM

## 2020-04-05 DIAGNOSIS — E1169 Type 2 diabetes mellitus with other specified complication: Secondary | ICD-10-CM | POA: Diagnosis not present

## 2020-04-05 DIAGNOSIS — E782 Mixed hyperlipidemia: Secondary | ICD-10-CM | POA: Diagnosis not present

## 2020-04-05 DIAGNOSIS — I152 Hypertension secondary to endocrine disorders: Secondary | ICD-10-CM

## 2020-04-05 DIAGNOSIS — E6609 Other obesity due to excess calories: Secondary | ICD-10-CM

## 2020-04-05 DIAGNOSIS — I10 Essential (primary) hypertension: Secondary | ICD-10-CM | POA: Diagnosis not present

## 2020-04-05 DIAGNOSIS — E785 Hyperlipidemia, unspecified: Secondary | ICD-10-CM

## 2020-04-05 DIAGNOSIS — Z6832 Body mass index (BMI) 32.0-32.9, adult: Secondary | ICD-10-CM

## 2020-04-05 HISTORY — DX: Immunization not carried out because of patient refusal: Z28.21

## 2020-04-05 MED ORDER — ROSUVASTATIN CALCIUM 20 MG PO TABS: 20.0000 mg | ORAL_TABLET | Freq: Every day | ORAL | 1 refills | Status: DC

## 2020-04-05 NOTE — Patient Instructions (Addendum)
Please work on getting back into a healthy diet, limiting fast food, fried food and sweets. Try and check your blood pressure periodically (a couple of times/month, more often if high). Try and get regular exercise as well (30 minutes 5 days/week at a minimum)  I encourage you to get your COVID vaccines.

## 2020-04-06 NOTE — Progress Notes (Signed)
Pt is coming in may 6 for labs and may 11th for appt

## 2020-05-13 ENCOUNTER — Other Ambulatory Visit: Payer: Self-pay | Admitting: Family Medicine

## 2020-05-13 DIAGNOSIS — I10 Essential (primary) hypertension: Secondary | ICD-10-CM

## 2020-05-13 MED ORDER — LISINOPRIL 20 MG PO TABS: 20.0000 mg | ORAL_TABLET | Freq: Every day | ORAL | 0 refills | Status: DC

## 2020-05-26 IMAGING — MG MM DIGITAL DIAGNOSTIC UNILAT*R* W/ TOMO W/ CAD
4 series · 4 of 12 positions shown · non-contrast
Comparison: Previous exam(s).

CLINICAL DATA: Patient presents as a recall from screen for a
possible right breast mass.

EXAM:
DIGITAL DIAGNOSTIC UNILATERAL RIGHT MAMMOGRAM WITH CAD AND TOMO

[R CC synth-2D]
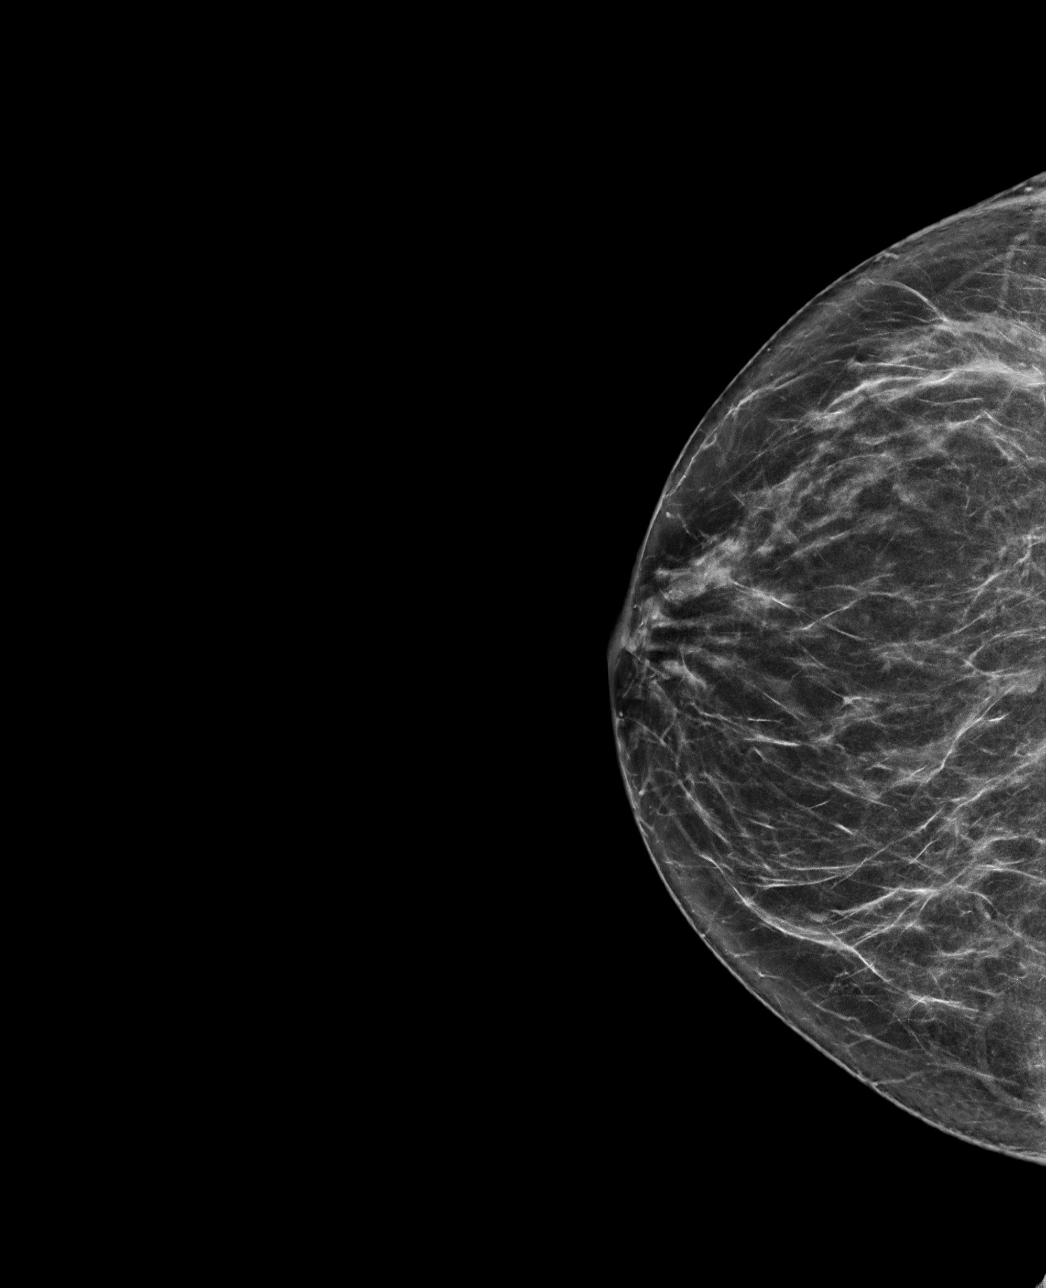

[R MLO synth-2D]
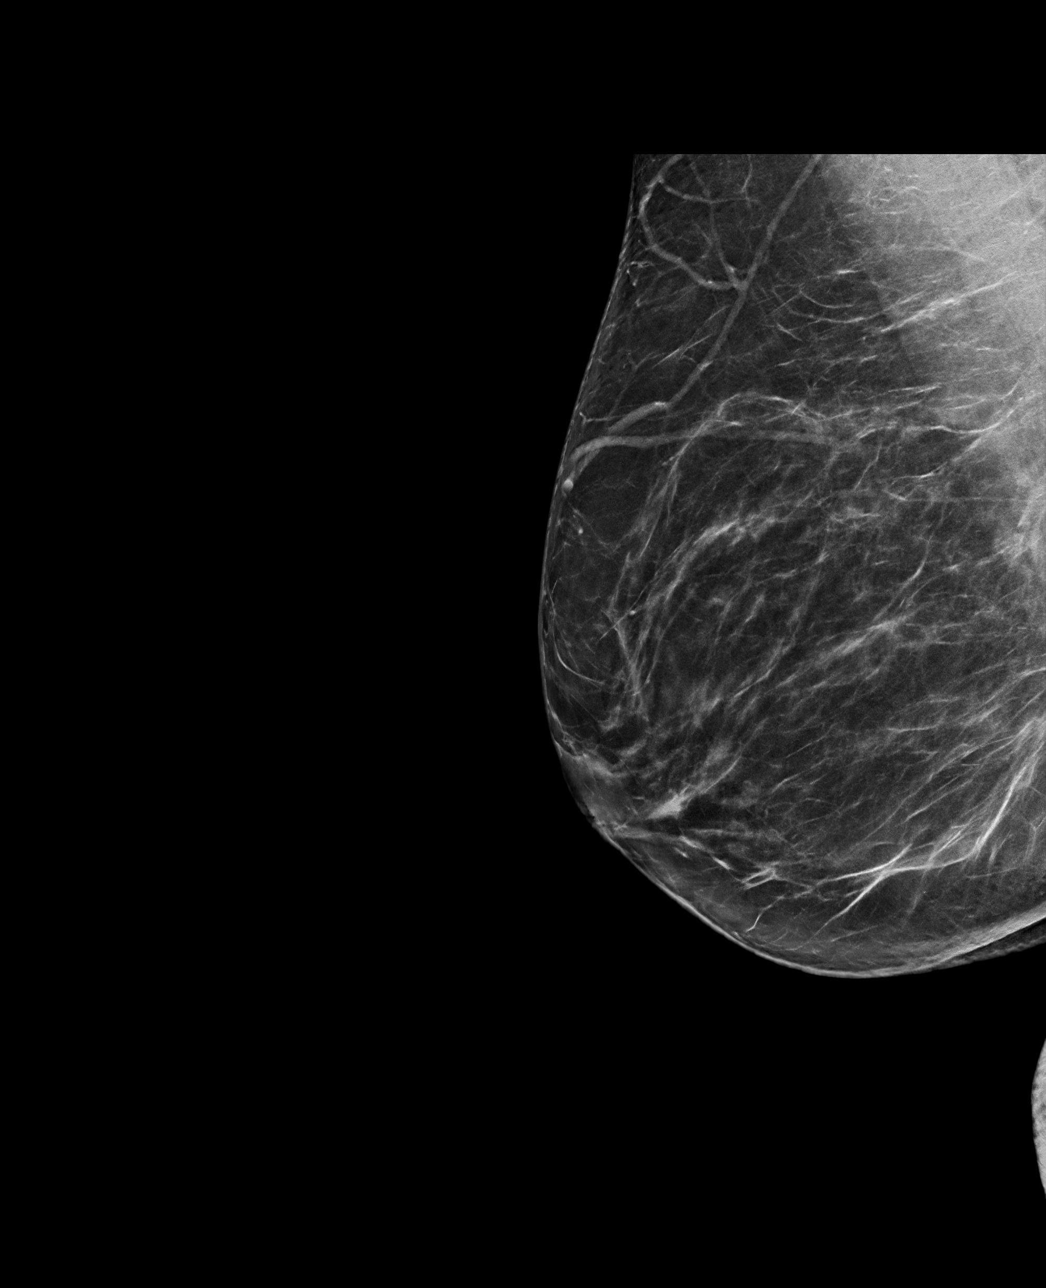

[R MLO tomo · tomo slice 39/77.0]
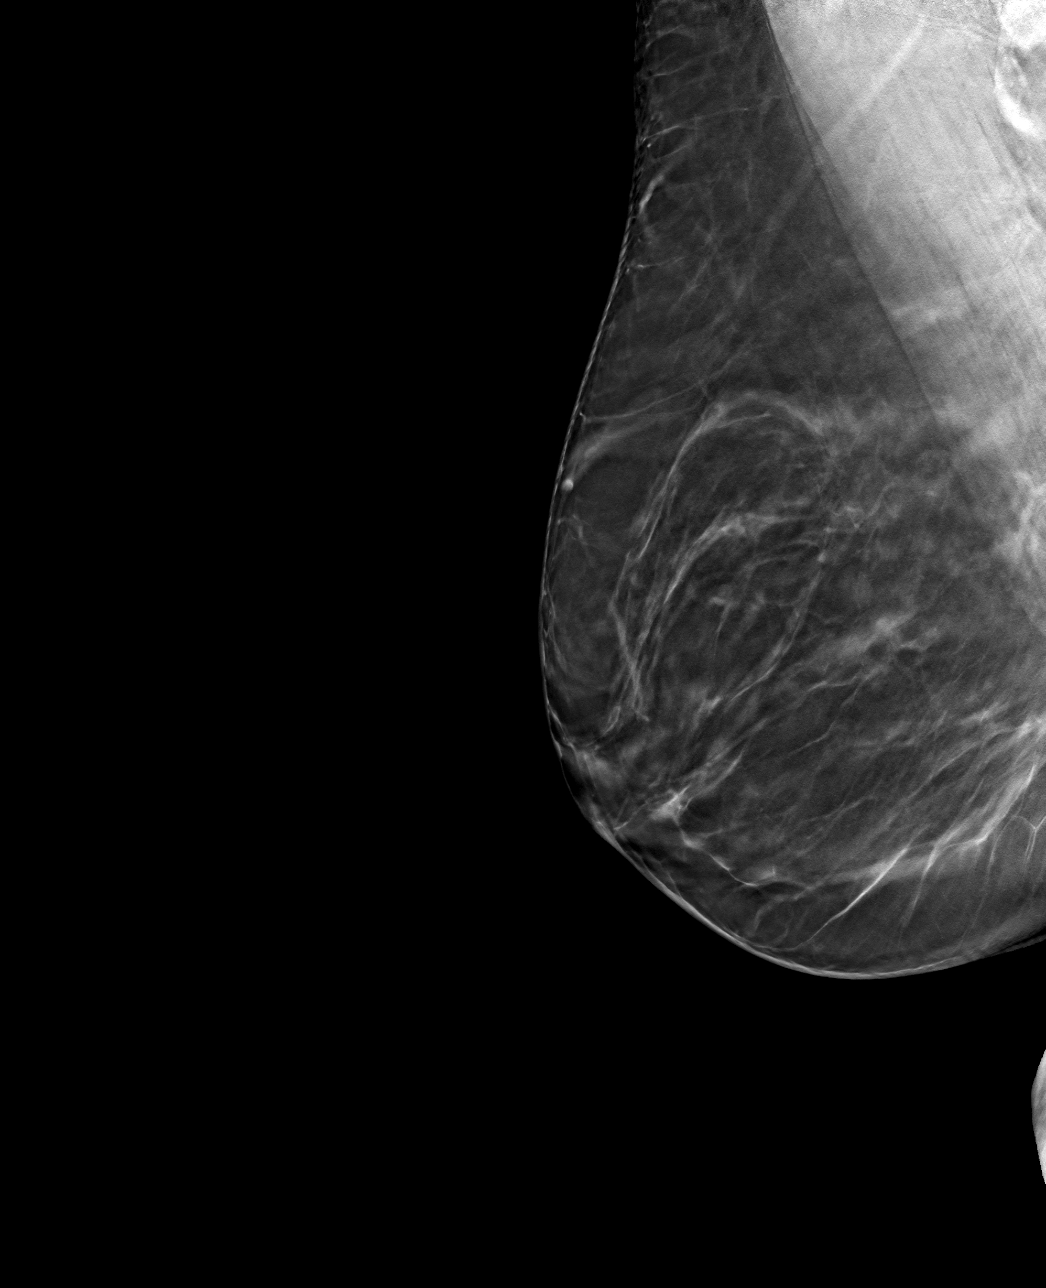

[R CC tomo · tomo slice 34/67.0]
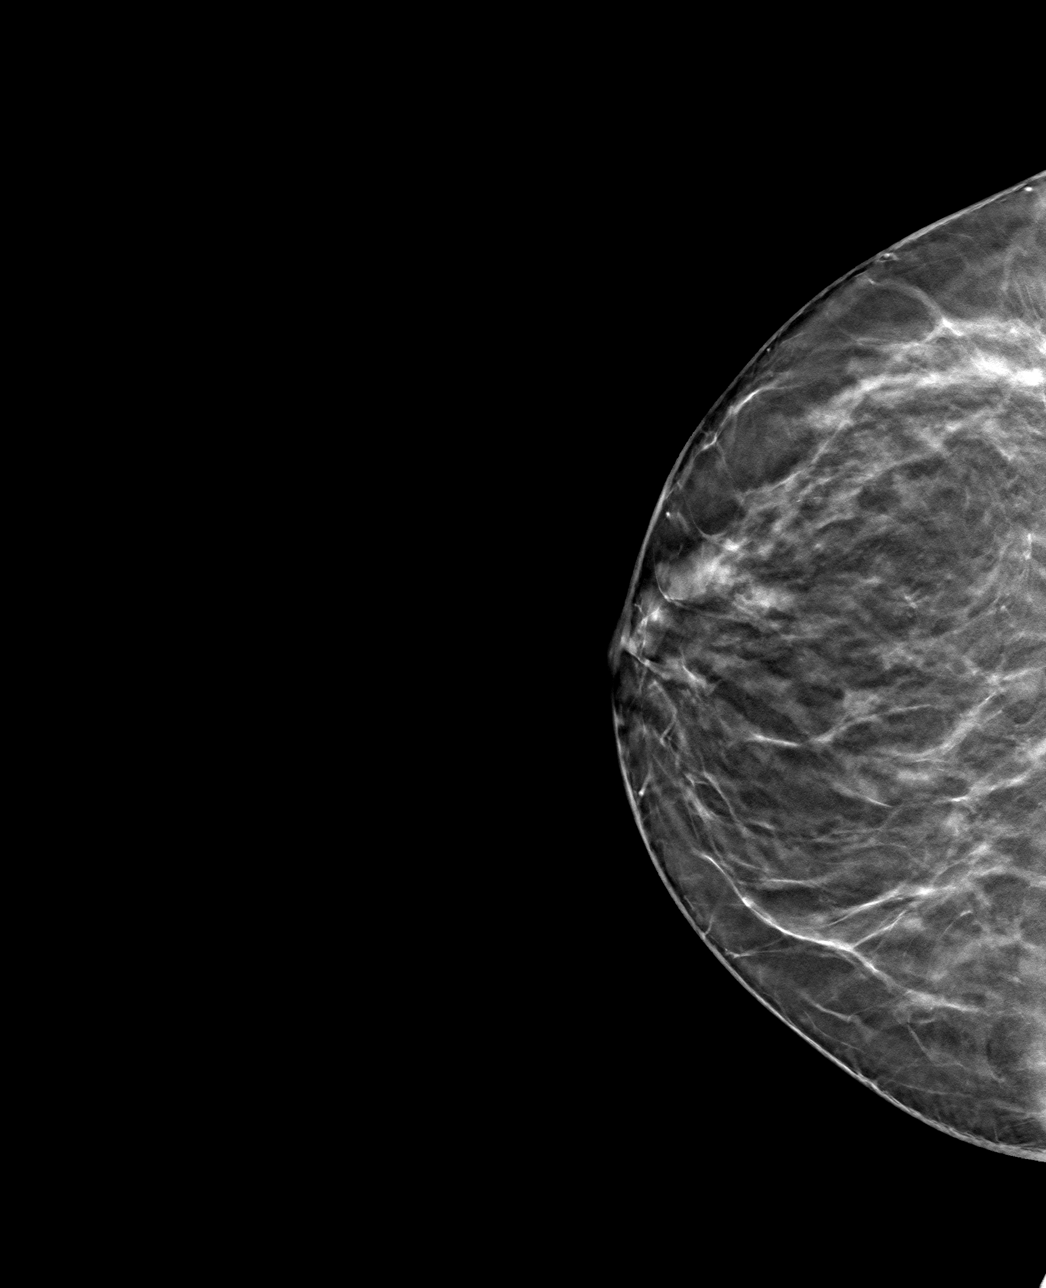

[4 of 12 positions shown; findings below may reference images not displayed]

ACR Breast Density Category b: There are scattered areas of
fibroglandular density.
FINDINGS: Full field CC MLO tomosynthesis views were performed to evaluate the
questioned mass in the right breast. On the additional imaging there
is no persistent abnormality. No mammographic evidence of
malignancy.

Mammographic images were processed with CAD.
IMPRESSION: No mammographic evidence of malignancy in the right breast.

RECOMMENDATION:
Screening mammogram in one year.(Code:[id number])

I have discussed the findings and recommendations with the patient.
If applicable, a reminder letter will be sent to the patient
regarding the next appointment.

BI-RADS CATEGORY  1: Negative.

## 2020-06-01 ENCOUNTER — Other Ambulatory Visit: Payer: Self-pay | Admitting: Family Medicine

## 2020-06-01 DIAGNOSIS — E1169 Type 2 diabetes mellitus with other specified complication: Secondary | ICD-10-CM

## 2020-06-02 ENCOUNTER — Other Ambulatory Visit: Payer: Self-pay | Admitting: Family Medicine

## 2020-06-02 DIAGNOSIS — E782 Mixed hyperlipidemia: Secondary | ICD-10-CM

## 2020-07-30 ENCOUNTER — Other Ambulatory Visit: Payer: Self-pay

## 2020-07-30 ENCOUNTER — Other Ambulatory Visit: Payer: No Typology Code available for payment source

## 2020-07-30 DIAGNOSIS — E782 Mixed hyperlipidemia: Secondary | ICD-10-CM

## 2020-07-30 DIAGNOSIS — E1169 Type 2 diabetes mellitus with other specified complication: Secondary | ICD-10-CM

## 2020-07-31 LAB — HEMOGLOBIN A1C
Est. average glucose Bld gHb Est-mCnc: 154 mg/dL
Hgb A1c MFr Bld: 7 % — ABNORMAL HIGH (ref 4.8–5.6)

## 2020-07-31 LAB — LIPID PANEL
Chol/HDL Ratio: 3.1 ratio (ref 0.0–4.4)
Cholesterol, Total: 128 mg/dL (ref 100–199)
HDL: 41 mg/dL (ref >39)
LDL Chol Calc (NIH): 59 mg/dL (ref 0–99)
Triglycerides: 167 mg/dL — ABNORMAL HIGH (ref 0–149)
VLDL Cholesterol Cal: 28 mg/dL (ref 5–40)

## 2020-07-31 LAB — GLUCOSE, RANDOM: Glucose: 139 mg/dL — ABNORMAL HIGH (ref 65–99)

## 2020-08-03 NOTE — Progress Notes (Signed)
Chief Complaint  Patient presents with  . Hypertension    Nonfasting med check, no concerns. Does not want covid vaccine.     Patient presents for 4 month follow-up.  Her A1c had been up to 7% at her January visit, and TG was also above goal at 202. She had been under a lot of stress (moved, mother passed away, eating a lot more fast food in the process of moving). Her medications weren't changed; discussed diet in detail.  Hyperlipidemia: Patientcontinues onCrestor 20mg , and Vascepa 2gm BID.  She reports compliance with medication and denies side effects.  Drinks decaf coffee, uses Sweet & Low, not sugar. Mostly drinking water, rare lemonade, no soda.  Going to [employer] 2x/week rather than daily, for breakfast (bacon, egg and cheese biscuit) +sandwiches for lunch, on wheat bread. No chips, potates, pasta. Not getting any exercise.  Diabetes follow-up: At her last visit she had been snacking on ice cream, eating more fast foods, drinking some lemonade. Cut back on lemonade, not eating much ice cream.  Not getting any execise. She is compliant with taking metformin 1000mg  BID and denies side effects. She is taking metformin in the morning and at bedtime. Blood sugarsare running in the 130's. 136 this morning.  Denies hypoglycemia,polydipsia and polyuria, numbness, tingling, neuropathy. Had her diabetic eye exam6/25/2021, no retinopathy. Shechecks feet regularly without concerns.  Hypertension follow-up: BP is not checked at home. Denies dizziness, headaches, chest pain. Denies side effects of medications. She is compliant in taking lisinopril 20mg  daily.   PMH, PSH, SH reviewed  Outpatient Encounter Medications as of 08/04/2020  Medication Sig  . estradiol (ESTRACE) 0.5 MG tablet Take 0.5 mg by mouth daily.  . metFORMIN (GLUCOPHAGE) 1000 MG tablet TAKE 1 TABLET (1,000 MG TOTAL) BY MOUTH 2 (TWO) TIMES DAILY WITH A MEAL.  . Multiple Vitamins-Minerals (MULTIVITAMIN WITH  MINERALS) tablet Take 1 tablet by mouth daily.  . rosuvastatin (CRESTOR) 20 MG tablet Take 1 tablet (20 mg total) by mouth daily.  09/21/2019 VASCEPA 1 g capsule TAKE 2 CAPSULES BY MOUTH 2 TIMES DAILY.  VITAMIN D, CHOLECALCIFEROL, PO Take 5,000 Int'l Units by mouth daily.  . [DISCONTINUED] lisinopril (ZESTRIL) 20 MG tablet Take 1 tablet (20 mg total) by mouth daily.  10/04/2020 lisinopril (ZESTRIL) 20 MG tablet Take 1 tablet (20 mg total) by mouth daily.   No facility-administered encounter medications on file as of 08/04/2020.   Allergies  Allergen Reactions  . Codeine Rash   ROS: no fever, chills, headaches, dizziness, URI symptoms, chest pain, shortness of breath, edema, cough. No GI or GU complaints.  Moods stable.  See HPI    PHYSICAL EXAM:  BP 130/80   Pulse 72   Ht 5' 6.5" (1.689 m)   Wt 210 lb (95.3 kg)   BMI 33.39 kg/m   Wt Readings from Last 3 Encounters:  08/04/20 210 lb (95.3 kg)  04/05/20 209 lb (94.8 kg)  09/25/19 213 lb (96.6 kg)   Pleasant, well-appearing female, in good spirits, in no distress HEENT: conjunctiva and sclera are clear, EOMI, wearing mask Neck: no lymphadenopathy, thyromegaly or carotid bruit Heart: regular rate and rhythm, no murmur Lungs: clear bilaterally Abdomen: soft, nontender, no mass Extremities: no edema Back: nontender Psych: normal mood, affect, hygiene and grooming Neuro: alert and oriented, normal gait.   Lab Results  Component Value Date   HGBA1C 7.0 (H) 07/30/2020   Fasting glucose 139  Lab Results  Component Value Date   CHOL 128 07/30/2020  HDL 41 07/30/2020   LDLCALC 59 07/30/2020   TRIG 167 (H) 07/30/2020   CHOLHDL 3.1 07/30/2020    ASSESSMENT/PLAN:  Type 2 diabetes mellitus with other specified complication, without long-term current use of insulin (HCC) - Borderline control. Discussed meds at length. Declines adding. Switch pm metformin to prior to dinner. Exercise, weight loss. Diet reviewed  Mixed hyperlipidemia - TG  improved, still somewhat elevated. Cont current regimen  Hyperlipidemia associated with type 2 diabetes mellitus (HCC)  Hypertension associated with diabetes (HCC)  Essential hypertension - controlled, continue lisinopril - Plan: lisinopril (ZESTRIL) 20 MG tablet  Class 1 obesity due to excess calories with serious comorbidity and body mass index (BMI) of 33.0 to 33.9 in adult - counseled re: diet, exercise, wt loss  Declined COVID vaccine  RF lisinopril Should have 1 month left of metformin and Vascepa Crestor should last until July  F/u 4 months, A1c at visit.

## 2020-08-04 ENCOUNTER — Ambulatory Visit (INDEPENDENT_AMBULATORY_CARE_PROVIDER_SITE_OTHER): Payer: No Typology Code available for payment source | Admitting: Family Medicine

## 2020-08-04 ENCOUNTER — Encounter: Payer: Self-pay | Admitting: Family Medicine

## 2020-08-04 VITALS — BP 130/80 | HR 72 | Ht 66.5 in | Wt 210.0 lb

## 2020-08-04 DIAGNOSIS — E1159 Type 2 diabetes mellitus with other circulatory complications: Secondary | ICD-10-CM | POA: Diagnosis not present

## 2020-08-04 DIAGNOSIS — I10 Essential (primary) hypertension: Secondary | ICD-10-CM | POA: Diagnosis not present

## 2020-08-04 DIAGNOSIS — E1169 Type 2 diabetes mellitus with other specified complication: Secondary | ICD-10-CM

## 2020-08-04 DIAGNOSIS — E6609 Other obesity due to excess calories: Secondary | ICD-10-CM

## 2020-08-04 DIAGNOSIS — E782 Mixed hyperlipidemia: Secondary | ICD-10-CM

## 2020-08-04 DIAGNOSIS — I152 Hypertension secondary to endocrine disorders: Secondary | ICD-10-CM

## 2020-08-04 DIAGNOSIS — Z6833 Body mass index (BMI) 33.0-33.9, adult: Secondary | ICD-10-CM

## 2020-08-04 DIAGNOSIS — E785 Hyperlipidemia, unspecified: Secondary | ICD-10-CM

## 2020-08-04 MED ORDER — LISINOPRIL 20 MG PO TABS
20.0000 mg | ORAL_TABLET | Freq: Every day | ORAL | 0 refills | Status: DC
Start: 2020-08-04 — End: 2020-11-09

## 2020-08-04 NOTE — Patient Instructions (Signed)
Change your evening metformin to take with (or prior to) your dinner (not at bedtime).  Continue the morning dose before breakfast.  Try and get daily exercise, watch your portions, cut back on carbs (biscuit/breads) and work on weight loss  We discussed medications including Jardiance or Farxiga (daily pills), versus weekly injections (Trulicity, Ozempic or Bydureon B-cise pens), or oral daily medications such as Onglyza, Januvia, or Tradjenta (these are also available in combination with metformin to reduce the .number of pills).  Continue to monitor your sugars--in the morning, and sometimes at bedtime (at least 2 hours after eating). If your sugars are consistently 140 or higher in the morning, or 160-170 or higher at bedtime, then we need to make some changes sooner rather than later.

## 2020-08-28 ENCOUNTER — Other Ambulatory Visit: Payer: Self-pay | Admitting: Family Medicine

## 2020-08-28 DIAGNOSIS — E1169 Type 2 diabetes mellitus with other specified complication: Secondary | ICD-10-CM

## 2020-09-01 ENCOUNTER — Other Ambulatory Visit: Payer: Self-pay | Admitting: Family Medicine

## 2020-09-01 DIAGNOSIS — E782 Mixed hyperlipidemia: Secondary | ICD-10-CM

## 2020-09-01 MED ORDER — VASCEPA 1 G PO CAPS: 2.0000 g | ORAL_CAPSULE | Freq: Two times a day (BID) | ORAL | 0 refills | Status: DC

## 2020-10-04 ENCOUNTER — Other Ambulatory Visit: Payer: Self-pay | Admitting: Family Medicine

## 2020-10-04 DIAGNOSIS — E782 Mixed hyperlipidemia: Secondary | ICD-10-CM

## 2020-11-09 ENCOUNTER — Other Ambulatory Visit: Payer: Self-pay

## 2020-11-09 ENCOUNTER — Encounter: Payer: Self-pay | Admitting: Family Medicine

## 2020-11-09 DIAGNOSIS — I10 Essential (primary) hypertension: Secondary | ICD-10-CM

## 2020-11-09 MED ORDER — LISINOPRIL 20 MG PO TABS: 20.0000 mg | ORAL_TABLET | Freq: Every day | ORAL | 0 refills | Status: DC

## 2020-11-25 ENCOUNTER — Other Ambulatory Visit: Payer: Self-pay | Admitting: Family Medicine

## 2020-11-25 ENCOUNTER — Other Ambulatory Visit: Payer: Self-pay | Admitting: *Deleted

## 2020-11-25 DIAGNOSIS — E1169 Type 2 diabetes mellitus with other specified complication: Secondary | ICD-10-CM

## 2020-11-25 DIAGNOSIS — E782 Mixed hyperlipidemia: Secondary | ICD-10-CM

## 2020-11-25 MED ORDER — VASCEPA 1 G PO CAPS: 2.0000 g | ORAL_CAPSULE | Freq: Two times a day (BID) | ORAL | 0 refills | Status: DC

## 2020-12-06 ENCOUNTER — Encounter: Payer: No Typology Code available for payment source | Admitting: Family Medicine

## 2021-01-04 ENCOUNTER — Other Ambulatory Visit: Payer: Self-pay | Admitting: Family Medicine

## 2021-01-04 DIAGNOSIS — E782 Mixed hyperlipidemia: Secondary | ICD-10-CM

## 2021-01-28 ENCOUNTER — Other Ambulatory Visit: Payer: Self-pay | Admitting: Family Medicine

## 2021-01-28 DIAGNOSIS — E782 Mixed hyperlipidemia: Secondary | ICD-10-CM

## 2021-11-30 ENCOUNTER — Encounter: Payer: Self-pay | Admitting: Internal Medicine

## 2022-01-03 ENCOUNTER — Encounter: Payer: Self-pay | Admitting: Internal Medicine

## 2022-02-27 LAB — COLOGUARD: Cologuard: NEGATIVE

## 2022-06-22 LAB — HM MAMMOGRAPHY

## 2022-06-28 LAB — HM PAP SMEAR: HPV, high-risk: NEGATIVE

## 2022-10-20 LAB — VITAMIN D 25 HYDROXY (VIT D DEFICIENCY, FRACTURES): Vit D, 25-Hydroxy: 65.7

## 2023-04-20 LAB — OPHTHALMOLOGY REPORT-SCANNED

## 2023-04-27 LAB — COMPREHENSIVE METABOLIC PANEL WITH GFR
Albumin: 4.5 (ref 3.5–5.0)
Calcium: 9.2 (ref 8.7–10.7)
Globulin: 2.5
eGFR: 103

## 2023-04-27 LAB — BASIC METABOLIC PANEL WITH GFR
BUN: 14 (ref 4–21)
CO2: 23 — AB (ref 13–22)
Chloride: 100 (ref 99–108)
Creatinine: 0.6 (ref 0.5–1.1)
Glucose: 140
Potassium: 4.6 meq/L (ref 3.5–5.1)
Sodium: 141 (ref 137–147)

## 2023-04-27 LAB — LIPID PANEL
Cholesterol: 151 (ref 0–200)
HDL: 52 (ref 35–70)
LDL Cholesterol: 68
LDl/HDL Ratio: 1.3
Triglycerides: 186 — AB (ref 40–160)

## 2023-04-27 LAB — PROTEIN / CREATININE RATIO, URINE
Albumin, U: 25
Creatinine, Urine: 70.8

## 2023-04-27 LAB — VITAMIN B12: Vitamin B-12: 504

## 2023-04-27 LAB — HEMOGLOBIN A1C: Hemoglobin A1C: 7.5

## 2023-04-27 LAB — HEPATIC FUNCTION PANEL
ALT: 25 U/L (ref 7–35)
AST: 21 (ref 13–35)
Alkaline Phosphatase: 66 (ref 25–125)
Bilirubin, Total: 0.6

## 2023-04-27 LAB — MICROALBUMIN / CREATININE URINE RATIO: Microalb Creat Ratio: 35

## 2023-06-21 DIAGNOSIS — M5412 Radiculopathy, cervical region: Secondary | ICD-10-CM | POA: Insufficient documentation

## 2023-06-28 LAB — HM MAMMOGRAPHY

## 2023-12-21 ENCOUNTER — Encounter: Payer: Self-pay | Admitting: Physician Assistant

## 2023-12-21 ENCOUNTER — Ambulatory Visit: Admitting: Physician Assistant

## 2023-12-21 VITALS — BP 120/74 | HR 75 | Temp 98.2°F | Ht 66.5 in | Wt 195.0 lb

## 2023-12-21 DIAGNOSIS — E559 Vitamin D deficiency, unspecified: Secondary | ICD-10-CM | POA: Insufficient documentation

## 2023-12-21 DIAGNOSIS — E1129 Type 2 diabetes mellitus with other diabetic kidney complication: Secondary | ICD-10-CM | POA: Diagnosis not present

## 2023-12-21 DIAGNOSIS — E1169 Type 2 diabetes mellitus with other specified complication: Secondary | ICD-10-CM | POA: Diagnosis not present

## 2023-12-21 DIAGNOSIS — E785 Hyperlipidemia, unspecified: Secondary | ICD-10-CM

## 2023-12-21 DIAGNOSIS — Z23 Encounter for immunization: Secondary | ICD-10-CM | POA: Diagnosis not present

## 2023-12-21 DIAGNOSIS — Z7984 Long term (current) use of oral hypoglycemic drugs: Secondary | ICD-10-CM | POA: Diagnosis not present

## 2023-12-21 DIAGNOSIS — R809 Proteinuria, unspecified: Secondary | ICD-10-CM

## 2023-12-21 DIAGNOSIS — E66811 Obesity, class 1: Secondary | ICD-10-CM | POA: Insufficient documentation

## 2023-12-21 DIAGNOSIS — Z0000 Encounter for general adult medical examination without abnormal findings: Secondary | ICD-10-CM

## 2023-12-21 DIAGNOSIS — I10 Essential (primary) hypertension: Secondary | ICD-10-CM

## 2023-12-21 DIAGNOSIS — M79644 Pain in right finger(s): Secondary | ICD-10-CM

## 2023-12-21 LAB — POCT GLYCOSYLATED HEMOGLOBIN (HGB A1C): Hemoglobin A1C: 6.6 % — AB (ref 4.0–5.6)

## 2023-12-21 MED ORDER — METFORMIN HCL 1000 MG PO TABS: 1000.0000 mg | ORAL_TABLET | Freq: Two times a day (BID) | ORAL | 0 refills | Status: DC

## 2023-12-21 MED ORDER — ROSUVASTATIN CALCIUM 20 MG PO TABS: 20.0000 mg | ORAL_TABLET | Freq: Every day | ORAL | 1 refills | Status: AC

## 2023-12-21 MED ORDER — LISINOPRIL 30 MG PO TABS: 30.0000 mg | ORAL_TABLET | Freq: Every day | ORAL | 1 refills | Status: AC

## 2023-12-21 MED ORDER — VASCEPA 1 G PO CAPS: 2.0000 g | ORAL_CAPSULE | Freq: Two times a day (BID) | ORAL | 1 refills | Status: AC

## 2023-12-21 NOTE — Progress Notes (Signed)
 Date:  12/21/2023   Name:  Judith Nolan   DOB:  [birth year]   MRN:  [id number]   Chief Complaint: Establish Care [facility]-Eye exam [facility]- mammo, pap)  HPI Judith Nolan is a very pleasant 58 year old female with history of HTN, DM2, and HLD who presents new to our practice today but known to me through her husband Cheryl, here to establish care and complete a routine physical exam.  Prior PCP Dr. Stephanie with [facility].  Records have been obtained and reviewed prior to visit today.  Last Dental Exam: 5y ago Last Eye Exam: Feb 2025 Last CRC screen: Cologuard neg few years ago Last Mammo: Apr 2025 [facility] [place] [facility] Normal Last Pap: 06/22/22 normal. S/p hysterectomy Immunizations Due: Flu  For 2 to 3 months, she has had swelling and pain at the right third MCP.  This was evaluated by Dr. Bonner with [facility] who suspected it might be rheumatoid arthritis and referred her to rheumatology, where she will be seen next month.   Medication list has been reviewed and updated.  Current Meds  Medication Sig   estradiol (ESTRACE) 0.5 MG tablet Take 0.5 mg by mouth daily.   Multiple Vitamins-Minerals (MULTIVITAMIN WITH MINERALS) tablet Take 1 tablet by mouth daily.   VITAMIN D , CHOLECALCIFEROL, PO Take 5,000 Int'l Units by mouth daily.   [DISCONTINUED] lisinopril  (ZESTRIL ) 30 MG tablet Take 30 mg by mouth daily. for high blood pressure   [DISCONTINUED] metFORMIN  (GLUCOPHAGE ) 1000 MG tablet TAKE 1 TABLET (1,000 MG TOTAL) BY MOUTH 2 (TWO) TIMES DAILY WITH A MEAL.   [DISCONTINUED] rosuvastatin  (CRESTOR ) 20 MG tablet TAKE 1 TABLET BY MOUTH EVERY DAY   [DISCONTINUED] VASCEPA  1 g capsule Take 2 capsules (2 g total) by mouth 2 (two) times daily.     Review of Systems  Patient Active Problem List   Diagnosis Date Noted   Vitamin D  deficiency 12/21/2023   Class 1 obesity with serious comorbidity in adult 12/21/2023   Microalbuminuria due to type 2 diabetes mellitus  (HCC) 12/21/2023   Long term current use of oral hypoglycemic drug 12/21/2023   Finger pain, right 12/21/2023   Cervical radiculopathy 06/21/2023   Osteoarthritis of right acromioclavicular joint 06/20/2019   Type 2 diabetes mellitus with other specified complication (HCC) 07/09/2015   Essential hypertension 07/09/2015   Hyperlipidemia associated with type 2 diabetes mellitus (HCC) 07/09/2015    Allergies  Allergen Reactions   Codeine Rash    Immunization History  Administered Date(s) Administered   Influenza, Seasonal, Injecte, Preservative Fre 12/21/2023   Influenza,inj,Quad PF,6+ Mos 01/03/2017, 01/09/2018, 12/24/2018, 12/31/2019   Pneumococcal Polysaccharide-23 12/27/2015   Tdap 12/27/2015   Zoster Recombinant(Shingrix ) 01/16/2018, 04/02/2018    Past Surgical History:  Procedure Laterality Date   ABDOMINAL HYSTERECTOMY     LAPAROSCOPIC VAGINAL HYSTERECTOMY WITH SALPINGO OOPHORECTOMY  03/28/2007   complete; for ovarian cysts, endometriosis   TONSILLECTOMY  age 73   TUBAL LIGATION      Social History   Tobacco Use   Smoking status: Former    Current packs/day: 0.00    Average packs/day: 0.3 packs/day for 1 year (0.3 ttl pk-yrs)    Types: Cigarettes    Start date: 65    Quit date: 1985    Years since quitting: 40.7   Smokeless tobacco: Never   Tobacco comments:    smoked x 1 year when she was 67  Vaping Use   Vaping status: Never Used  Substance Use Topics  Alcohol use: No    Alcohol/week: 0.0 standard drinks of alcohol   Drug use: No    Family History  Problem Relation Age of Onset   Hypertension Mother    Heart attack Maternal Uncle        60's   Alcohol abuse Maternal Uncle    Cancer Maternal Grandmother        breast cancer in her 68's   Breast cancer Maternal Grandmother    Cancer Maternal Grandfather        colon (over the age of 9)   Colon cancer Maternal Grandfather    Diabetes Paternal Grandmother    Diabetes Paternal Grandfather     Heart disease Neg Hx    Stroke Neg Hx         12/21/2023    1:40 PM  GAD 7 : Generalized Anxiety Score  Nervous, Anxious, on Edge 0  Control/stop worrying 0  Worry too much - different things 0  Trouble relaxing 0  Restless 0  Easily annoyed or irritable 0  Afraid - awful might happen 0  Total GAD 7 Score 0  Anxiety Difficulty Not difficult at all       12/21/2023    1:40 PM 08/04/2020    2:58 PM 09/11/2018    3:37 PM  Depression screen PHQ 2/9  Decreased Interest 0 0 0  Down, Depressed, Hopeless 0 0 0  PHQ - 2 Score 0 0 0    BP Readings from Last 3 Encounters:  12/21/23 120/74  08/04/20 130/80  04/05/20 129/81    Wt Readings from Last 3 Encounters:  12/21/23 195 lb (88.5 kg)  08/04/20 210 lb (95.3 kg)  04/05/20 209 lb (94.8 kg)    BP 120/74   Pulse 75   Temp 98.2 F (36.8 C)   Ht 5' 6.5 (1.689 m)   Wt 195 lb (88.5 kg)   SpO2 96%   BMI 31.00 kg/m   Physical Exam Vitals and nursing note reviewed. Exam conducted with a chaperone present.  Constitutional:      Appearance: Normal appearance. She is well-groomed.  HENT:     Ears:     Comments: EAC clear bilaterally with good view of TM which is without effusion or erythema.     Nose: Nose normal.     Mouth/Throat:     Mouth: Mucous membranes are moist. No oral lesions.     Dentition: Normal dentition.     Pharynx: Uvula midline. No posterior oropharyngeal erythema.  Eyes:     General: Vision grossly intact.     Extraocular Movements: Extraocular movements intact.     Conjunctiva/sclera: Conjunctivae normal.     Pupils: Pupils are equal, round, and reactive to light.  Neck:     Thyroid: No thyroid mass or thyromegaly.  Cardiovascular:     Rate and Rhythm: Normal rate and regular rhythm.     Heart sounds: S1 normal and S2 normal. No murmur heard.    No friction rub. No gallop.     Comments: Pulses 2+ at radial, PT, DP bilaterally. No carotid bruit. No peripheral edema Pulmonary:     Effort: Pulmonary  effort is normal.     Breath sounds: Normal breath sounds.  Chest:     Comments: Deferred Abdominal:     General: Bowel sounds are normal.     Palpations: Abdomen is soft. There is no mass.     Tenderness: There is no abdominal tenderness.  Genitourinary:  Comments: Deferred Musculoskeletal:     Comments: Full ROM with strength 5/5 bilateral upper and lower extremities. Obvious edema and deformity of right third MCP, slightly tender to palpation with mild overlying erythema.  ROM is mostly preserved, though slightly limited due to edema.  Lymphadenopathy:     Cervical: No cervical adenopathy.  Skin:    General: Skin is warm.     Capillary Refill: Capillary refill takes less than 2 seconds.     Findings: No lesion or rash.  Neurological:     Mental Status: She is alert and oriented to person, place, and time.     Cranial Nerves: Cranial nerves 2-12 are intact.     Gait: Gait is intact.  Psychiatric:        Mood and Affect: Mood and affect normal.        Behavior: Behavior normal.     Recent Labs     Component Value Date/Time   NA 141 04/01/2020 0844   K 4.5 04/01/2020 0844   CL 102 04/01/2020 0844   CO2 23 04/01/2020 0844   GLUCOSE 139 (H) 07/30/2020 0840   GLUCOSE 123 (H) 12/29/2016 0754   BUN 12 04/01/2020 0844   CREATININE 0.72 04/01/2020 0844   CREATININE 0.75 12/29/2016 0754   CALCIUM  9.6 04/01/2020 0844   PROT 7.3 04/01/2020 0844   ALBUMIN 4.4 04/01/2020 0844   AST 28 04/01/2020 0844   ALT 30 04/01/2020 0844   ALKPHOS 60 04/01/2020 0844   BILITOT 0.5 04/01/2020 0844   GFRNONAA 94 04/01/2020 0844   GFRAA 108 04/01/2020 0844    Lab Results  Component Value Date   WBC 7.5 04/01/2020   HGB 13.5 04/01/2020   HCT 39.6 04/01/2020   MCV 84 04/01/2020   PLT 284 04/01/2020   Lab Results  Component Value Date   HGBA1C 6.6 (A) 12/21/2023   HGBA1C 7.0 (H) 07/30/2020   HGBA1C 7.0 (H) 04/01/2020   Lab Results  Component Value Date   CHOL 128 07/30/2020    HDL 41 07/30/2020   LDLCALC 59 07/30/2020   TRIG 167 (H) 07/30/2020   CHOLHDL 3.1 07/30/2020   Lab Results  Component Value Date   TSH 4.080 09/12/2019      Assessment and Plan:  1. Annual physical exam (Primary) Encouraged healthy lifestyle including regular physical activity and consumption of whole fruits and vegetables. Encouraged routine dental and eye exams. Vaccinations up to date.   Patient will be returning at a date convenient to her for fasting lab work.  - CBC with Differential/Platelet - Comprehensive metabolic panel with GFR - TSH - Lipid panel - Microalbumin / creatinine urine ratio  2. Type 2 diabetes mellitus with other specified complication, without long-term current use of insulin (HCC) A1c 6.6% today reflecting ongoing glycemic control.  Continue metformin  at the current dose.  Repeat routine labs including micral. - POCT HgB A1C - CBC with Differential/Platelet - Comprehensive metabolic panel with GFR - TSH - Lipid panel - Microalbumin / creatinine urine ratio - metFORMIN  (GLUCOPHAGE ) 1000 MG tablet; Take 1 tablet (1,000 mg total) by mouth 2 (two) times daily with a meal.  Dispense: 180 tablet; Refill: 0  3. Microalbuminuria due to type 2 diabetes mellitus (HCC) - Microalbumin / creatinine urine ratio  4. Hyperlipidemia associated with type 2 diabetes mellitus (HCC) Repeat fasting lipids, refill rosuvastatin  and Vascepa  as previously prescribed - Lipid panel - rosuvastatin  (CRESTOR ) 20 MG tablet; Take 1 tablet (20 mg total) by mouth daily.  Dispense: 90 tablet; Refill: 1 - VASCEPA  1 g capsule; Take 2 capsules (2 g total) by mouth 2 (two) times daily.  Dispense: 360 capsule; Refill: 1  5. Long term current use of oral hypoglycemic drug Continue metformin   6. Essential hypertension Continue lisinopril  which seems to be controlling blood pressure well and is also renoprotective - lisinopril  (ZESTRIL ) 30 MG tablet; Take 1 tablet (30 mg total) by  mouth daily. for high blood pressure  Dispense: 90 tablet; Refill: 1  7. Finger pain, right Certainly could be rheumatoid arthritis, but with monoarthropathy it would be wise to consider gouty tophus on the differential.  Will check uric acid today, and through shared decision making with patient, will defer the remainder of the workup to include imaging and rheumatoid testing to her rheumatologist. - Uric acid  8. Encounter for immunization Flu shot given today - Flu vaccine trivalent PF, 6mos and older(Flulaval,Afluria,Fluarix,Fluzone)    Return in about 3 months (around 03/21/2024).    Rolan Hoyle, PA-C, DMSc, Nutritionist [facility] [phone]

## 2023-12-24 DIAGNOSIS — Z90710 Acquired absence of both cervix and uterus: Secondary | ICD-10-CM | POA: Insufficient documentation

## 2023-12-27 ENCOUNTER — Ambulatory Visit: Payer: Self-pay | Admitting: Physician Assistant

## 2023-12-27 LAB — COMPREHENSIVE METABOLIC PANEL WITH GFR
ALT: 17 IU/L (ref 0–32)
AST: 19 IU/L (ref 0–40)
Albumin: 4.6 g/dL (ref 3.8–4.9)
Alkaline Phosphatase: 53 IU/L (ref 49–135)
BUN/Creatinine Ratio: 23 (ref 12–28)
BUN: 15 mg/dL (ref 8–27)
Bilirubin Total: 0.5 mg/dL (ref 0.0–1.2)
CO2: 24 mmol/L (ref 20–29)
Calcium: 9.8 mg/dL (ref 8.7–10.3)
Chloride: 102 mmol/L (ref 96–106)
Creatinine, Ser: 0.65 mg/dL (ref 0.57–1.00)
Globulin, Total: 2.4 g/dL (ref 1.5–4.5)
Glucose: 140 mg/dL — ABNORMAL HIGH (ref 70–99)
Potassium: 4.5 mmol/L (ref 3.5–5.2)
Sodium: 141 mmol/L (ref 134–144)
Total Protein: 7 g/dL (ref 6.0–8.5)
eGFR: 101 mL/min/1.73 (ref >59)

## 2023-12-27 LAB — MICROALBUMIN / CREATININE URINE RATIO
Creatinine, Urine: 97.2 mg/dL
Microalb/Creat Ratio: 26 mg/g{creat} (ref 0–29)
Microalbumin, Urine: 25.2 ug/mL

## 2023-12-27 LAB — CBC WITH DIFFERENTIAL/PLATELET
Basophils Absolute: 0.1 x10E3/uL (ref 0.0–0.2)
Basos: 1 %
EOS (ABSOLUTE): 0.3 x10E3/uL (ref 0.0–0.4)
Eos: 4 %
Hematocrit: 38.6 % (ref 34.0–46.6)
Hemoglobin: 13 g/dL (ref 11.1–15.9)
Immature Grans (Abs): 0 x10E3/uL (ref 0.0–0.1)
Immature Granulocytes: 0 %
Lymphocytes Absolute: 2.4 x10E3/uL (ref 0.7–3.1)
Lymphs: 34 %
MCH: 29.7 pg (ref 26.6–33.0)
MCHC: 33.7 g/dL (ref 31.5–35.7)
MCV: 88 fL (ref 79–97)
Monocytes Absolute: 0.5 x10E3/uL (ref 0.1–0.9)
Monocytes: 7 %
Neutrophils Absolute: 4 x10E3/uL (ref 1.4–7.0)
Neutrophils: 54 %
Platelets: 280 x10E3/uL (ref 150–450)
RBC: 4.38 x10E6/uL (ref 3.77–5.28)
RDW: 12.7 % (ref 11.7–15.4)
WBC: 7.2 x10E3/uL (ref 3.4–10.8)

## 2023-12-27 LAB — LIPID PANEL
Chol/HDL Ratio: 2.7 ratio (ref 0.0–4.4)
Cholesterol, Total: 141 mg/dL (ref 100–199)
HDL: 52 mg/dL (ref >39)
LDL Chol Calc (NIH): 66 mg/dL (ref 0–99)
Triglycerides: 128 mg/dL (ref 0–149)
VLDL Cholesterol Cal: 23 mg/dL (ref 5–40)

## 2023-12-27 LAB — URIC ACID: Uric Acid: 3.8 mg/dL (ref 3.0–7.2)

## 2023-12-27 LAB — TSH: TSH: 3.17 u[IU]/mL (ref 0.450–4.500)

## 2024-03-17 ENCOUNTER — Other Ambulatory Visit: Payer: Self-pay

## 2024-03-17 DIAGNOSIS — M0600 Rheumatoid arthritis without rheumatoid factor, unspecified site: Secondary | ICD-10-CM | POA: Insufficient documentation

## 2024-03-17 DIAGNOSIS — M1991 Primary osteoarthritis, unspecified site: Secondary | ICD-10-CM | POA: Insufficient documentation

## 2024-03-17 DIAGNOSIS — M064 Inflammatory polyarthropathy: Secondary | ICD-10-CM | POA: Insufficient documentation

## 2024-03-20 ENCOUNTER — Other Ambulatory Visit: Payer: Self-pay | Admitting: Physician Assistant

## 2024-03-20 DIAGNOSIS — E1169 Type 2 diabetes mellitus with other specified complication: Secondary | ICD-10-CM

## 2024-03-21 NOTE — Telephone Encounter (Signed)
 Requested Prescriptions  Pending Prescriptions Disp Refills   metFORMIN  (GLUCOPHAGE ) 1000 MG tablet [Pharmacy Med Name: METFORMIN  HCL 1,000 MG TABLET] 180 tablet 0    Sig: TAKE 1 TABLET (1,000 MG TOTAL) BY MOUTH TWICE A DAY WITH FOOD     Endocrinology:  Diabetes - Biguanides Passed - 03/21/2024  3:05 PM      Passed - Cr in normal range and within 360 days    Creat  Date Value Ref Range Status  12/29/2016 0.75 0.50 - 1.05 mg/dL Final    Comment:    For patients >73 years of age, the reference limit for Creatinine is approximately 13% higher for people identified as African-American. .    Creatinine, Ser  Date Value Ref Range Status  12/26/2023 0.65 0.57 - 1.00 mg/dL Final   Creatinine, Urine  Date Value Ref Range Status  04/27/2023 70.8  Final         Passed - HBA1C is between 0 and 7.9 and within 180 days    Hemoglobin A1C  Date Value Ref Range Status  12/21/2023 6.6 (A) 4.0 - 5.6 % Final  04/27/2023 7.5  Final         Passed - eGFR in normal range and within 360 days    GFR calc Af Amer  Date Value Ref Range Status  04/01/2020 108 >59 mL/min/1.73 Final    Comment:    **In accordance with recommendations from the NKF-ASN Task force,**   [facility] is in the process of updating its eGFR calculation to the   2021 CKD-EPI creatinine equation that estimates kidney function   without a race variable.    GFR calc non Af Amer  Date Value Ref Range Status  04/01/2020 94 >59 mL/min/1.73 Final   eGFR  Date Value Ref Range Status  12/26/2023 101 >59 mL/min/1.73 Final         Passed - B12 Level in normal range and within 720 days    Vitamin B-12  Date Value Ref Range Status  04/27/2023 504  Final         Passed - Valid encounter within last 6 months    Recent Outpatient Visits           3 months ago Annual physical exam   [facility], Toribio SQUIBB, PA              Passed - CBC within normal limits and  completed in the last 12 months    WBC  Date Value Ref Range Status  12/26/2023 7.2 3.4 - 10.8 x10E3/uL Final  10/10/2007 13.9 (H)  Final   RBC  Date Value Ref Range Status  12/26/2023 4.38 3.77 - 5.28 x10E6/uL Final  10/10/2007 3.74 (L)  Final   Hemoglobin  Date Value Ref Range Status  12/26/2023 13.0 11.1 - 15.9 g/dL Final   Hematocrit  Date Value Ref Range Status  12/26/2023 38.6 34.0 - 46.6 % Final   MCHC  Date Value Ref Range Status  12/26/2023 33.7 31.5 - 35.7 g/dL Final  92/83/7990 65.7  Final   MCH  Date Value Ref Range Status  12/26/2023 29.7 26.6 - 33.0 pg Final   MCV  Date Value Ref Range Status  12/26/2023 88 79 - 97 fL Final   No results found for: PLTCOUNTKUC, LABPLAT, POCPLA RDW  Date Value Ref Range Status  12/26/2023 12.7 11.7 - 15.4 % Final

## 2024-03-24 ENCOUNTER — Ambulatory Visit: Admitting: Physician Assistant

## 2024-03-24 ENCOUNTER — Encounter: Payer: Self-pay | Admitting: Physician Assistant

## 2024-03-24 VITALS — BP 114/78 | HR 73 | Temp 98.2°F | Ht 66.5 in | Wt 195.0 lb

## 2024-03-24 DIAGNOSIS — Z7984 Long term (current) use of oral hypoglycemic drugs: Secondary | ICD-10-CM | POA: Diagnosis not present

## 2024-03-24 DIAGNOSIS — E1169 Type 2 diabetes mellitus with other specified complication: Secondary | ICD-10-CM | POA: Diagnosis not present

## 2024-03-24 DIAGNOSIS — H6991 Unspecified Eustachian tube disorder, right ear: Secondary | ICD-10-CM | POA: Diagnosis not present

## 2024-03-24 LAB — POCT GLYCOSYLATED HEMOGLOBIN (HGB A1C): Hemoglobin A1C: 6.9 % — AB (ref 4.0–5.6)

## 2024-03-24 NOTE — Progress Notes (Addendum)
 "   Date:  03/24/2024   Name:  Judith Nolan   DOB:  May 14, 1965   MRN:  [id number]   Chief Complaint: Diabetes  HPI  [name] returns for 3 month f/u on DM2, last A1c 6.6% in Sept, presently controlled with metformin  1000 mg BID.   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   Having some right-sided ear pressure for the last 1-2 weeks.   Medication list has been reviewed and updated.  Active Medications[1]   Review of Systems  Patient Active Problem List   Diagnosis Date Noted   Inflammatory polyarthritis (HCC) 03/17/2024   Primary osteoarthritis 03/17/2024   Seronegative rheumatoid arthritis (HCC) 03/17/2024   H/O: hysterectomy 12/24/2023   Vitamin D  deficiency 12/21/2023   Class 1 obesity with serious comorbidity in adult 12/21/2023   Microalbuminuria due to type 2 diabetes mellitus (HCC) 12/21/2023   Long term current use of oral hypoglycemic drug 12/21/2023   Finger pain, right 12/21/2023   Cervical radiculopathy 06/21/2023   Osteoarthritis of right acromioclavicular joint 06/20/2019   Type 2 diabetes mellitus with other specified complication (HCC) 07/09/2015   Essential hypertension 07/09/2015   Hyperlipidemia associated with type 2 diabetes mellitus (HCC) 07/09/2015    Allergies[2]  Immunization History  Administered Date(s) Administered   Influenza, Seasonal, Injecte, Preservative Fre 12/21/2023   Influenza,inj,Quad PF,6+ Mos 01/03/2017, 01/09/2018, 12/24/2018, 12/31/2019   PNEUMOCOCCAL CONJUGATE-20 04/27/2023   Pneumococcal Polysaccharide-23 12/27/2015   Tdap 12/27/2015   Zoster Recombinant(Shingrix ) 01/16/2018, 04/02/2018    Past Surgical History:  Procedure Laterality Date   ABDOMINAL HYSTERECTOMY     LAPAROSCOPIC VAGINAL HYSTERECTOMY WITH SALPINGO OOPHORECTOMY  03/28/2007   complete; for ovarian cysts, endometriosis   TONSILLECTOMY  age 66   TUBAL LIGATION      Social History[3]  Family History   Problem Relation Age of Onset   Hypertension Mother    Heart attack Maternal Uncle        60's   Alcohol abuse Maternal Uncle    Cancer Maternal Grandmother        breast cancer in her 44's   Breast cancer Maternal Grandmother    Cancer Maternal Grandfather        colon (over the age of 42)   Colon cancer Maternal Grandfather    Diabetes Paternal Grandmother    Diabetes Paternal Grandfather    Heart disease Neg Hx    Stroke Neg Hx         03/24/2024    1:57 PM 12/21/2023    1:40 PM  GAD 7 : Generalized Anxiety Score  Nervous, Anxious, on Edge 0 0  Control/stop worrying 0 0  Worry too much - different things 0 0  Trouble relaxing 0 0  Restless 0 0  Easily annoyed or irritable 0 0  Afraid - awful might happen 0 0  Total GAD 7 Score 0 0  Anxiety Difficulty Not difficult at all Not difficult at all       03/24/2024    1:57 PM 12/21/2023    1:40 PM 08/04/2020    2:58 PM  Depression screen PHQ 2/9  Decreased Interest 0 0 0  Down, Depressed, Hopeless 0 0 0  PHQ - 2 Score 0 0 0    BP Readings from Last 3 Encounters:  03/24/24 114/78  12/21/23 120/74  08/04/20 130/80    Wt Readings from Last 3 Encounters:  03/24/24 195 lb (88.5 kg)  12/21/23 195 lb (88.5  kg)  08/04/20 210 lb (95.3 kg)    BP 114/78   Pulse 73   Temp 98.2 F (36.8 C)   Ht 5' 6.5 (1.689 m)   Wt 195 lb (88.5 kg)   SpO2 95%   BMI 31.00 kg/m   Physical Exam Vitals and nursing note reviewed.  Constitutional:      Appearance: Normal appearance.  Cardiovascular:     Rate and Rhythm: Normal rate.  Pulmonary:     Effort: Pulmonary effort is normal.  Abdominal:     General: There is no distension.  Musculoskeletal:        General: Normal range of motion.  Skin:    General: Skin is warm and dry.  Neurological:     Mental Status: She is alert and oriented to person, place, and time.     Gait: Gait is intact.  Psychiatric:        Mood and Affect: Mood and affect normal.     Recent  Labs     Component Value Date/Time   NA 141 12/26/2023 0939   K 4.5 12/26/2023 0939   CL 102 12/26/2023 0939   CO2 24 12/26/2023 0939   GLUCOSE 140 (H) 12/26/2023 0939   GLUCOSE 123 (H) 12/29/2016 0754   BUN 15 12/26/2023 0939   CREATININE 0.65 12/26/2023 0939   CREATININE 0.75 12/29/2016 0754   CALCIUM  9.8 12/26/2023 0939   PROT 7.0 12/26/2023 0939   ALBUMIN 4.6 12/26/2023 0939   AST 19 12/26/2023 0939   ALT 17 12/26/2023 0939   ALKPHOS 53 12/26/2023 0939   BILITOT 0.5 12/26/2023 0939   GFRNONAA 94 04/01/2020 0844   GFRAA 108 04/01/2020 0844    Lab Results  Component Value Date   WBC 7.2 12/26/2023   HGB 13.0 12/26/2023   HCT 38.6 12/26/2023   MCV 88 12/26/2023   PLT 280 12/26/2023   Lab Results  Component Value Date   HGBA1C 6.9 (A) 03/24/2024   HGBA1C 6.6 (A) 12/21/2023   HGBA1C 7.5 04/27/2023   Lab Results  Component Value Date   CHOL 141 12/26/2023   HDL 52 12/26/2023   LDLCALC 66 12/26/2023   TRIG 128 12/26/2023   CHOLHDL 2.7 12/26/2023   Lab Results  Component Value Date   TSH 3.170 12/26/2023      Assessment and Plan:  Type 2 diabetes mellitus with other specified complication, without long-term current use of insulin (HCC) Assessment & Plan: Continues to be well-managed with metformin , A1c 6.9% today. Continue current regimen.   Orders: -     POCT glycosylated hemoglobin (Hb A1C)  Long term current use of oral hypoglycemic drug  Eustachian tube dysfunction, right  Try OTC antihistamine, chewing gum, possibly intranasal steroid.   F/u 4 mo OV DM2   Rolan Hoyle, PA-C, DMSc, DipACLM, Nutritionist [facility] Primary Care and Sports Medicine [facility] [phone]      [1]  Current Meds  Medication Sig   estradiol (ESTRACE) 0.5 MG tablet Take 0.5 mg by mouth daily.   hydroxychloroquine (PLAQUENIL) 200 MG tablet 1 tab Orally twice a day; Duration: 90 days   ibuprofen (ADVIL) 800 MG tablet 1  tablet with food or milk as needed Orally 3 times a day; Duration: 90 days   lisinopril  (ZESTRIL ) 30 MG tablet Take 1 tablet (30 mg total) by mouth daily. for high blood pressure   metFORMIN  (GLUCOPHAGE ) 1000 MG tablet TAKE 1 TABLET (1,000 MG TOTAL) BY MOUTH TWICE  A DAY WITH FOOD   Multiple Vitamins-Minerals (MULTIVITAMIN WITH MINERALS) tablet Take 1 tablet by mouth daily.   rosuvastatin  (CRESTOR ) 20 MG tablet Take 1 tablet (20 mg total) by mouth daily.   VASCEPA  1 g capsule Take 2 capsules (2 g total) by mouth 2 (two) times daily.   VITAMIN D , CHOLECALCIFEROL, PO Take 5,000 Int'l Units by mouth daily.  [2]  Allergies Allergen Reactions   Codeine Rash  [3]  Social History Tobacco Use   Smoking status: Former    Current packs/day: 0.00    Average packs/day: 0.3 packs/day for 1 year (0.3 ttl pk-yrs)    Types: Cigarettes    Start date: 16    Quit date: 19    Years since quitting: 41.0   Smokeless tobacco: Never   Tobacco comments:    smoked x 1 year when she was 60  Vaping Use   Vaping status: Never Used  Substance Use Topics   Alcohol use: No    Alcohol/week: 0.0 standard drinks of alcohol   Drug use: No   "

## 2024-03-24 NOTE — Assessment & Plan Note (Signed)
 Continues to be well-managed with metformin , A1c 6.9% today. Continue current regimen.

## 2024-07-23 ENCOUNTER — Ambulatory Visit: Admitting: Physician Assistant

## 2024-12-26 ENCOUNTER — Encounter: Admitting: Physician Assistant
